# Patient Record
Sex: Female | Born: 1995 | Hispanic: Yes | Marital: Single | Smoking: Never smoker
Health system: Southern US, Community
[De-identification: ages and names within clinical notes are randomized; demographics above are authoritative.]

## PROBLEM LIST (undated history)

## (undated) DIAGNOSIS — F419 Anxiety disorder, unspecified: Secondary | ICD-10-CM

## (undated) DIAGNOSIS — R519 Headache, unspecified: Secondary | ICD-10-CM

## (undated) HISTORY — DX: Headache, unspecified: R51.9

## (undated) HISTORY — DX: Anxiety disorder, unspecified: F41.9

---

## 2020-01-14 ENCOUNTER — Other Ambulatory Visit: Payer: Self-pay

## 2020-01-17 ENCOUNTER — Other Ambulatory Visit: Payer: Self-pay

## 2020-01-17 ENCOUNTER — Encounter: Payer: Self-pay | Admitting: Nurse Practitioner

## 2020-01-17 ENCOUNTER — Ambulatory Visit (INDEPENDENT_AMBULATORY_CARE_PROVIDER_SITE_OTHER): Payer: Managed Care, Other (non HMO) | Admitting: Nurse Practitioner

## 2020-01-17 VITALS — BP 136/78 | HR 95 | Temp 98.4°F | Ht 60.0 in | Wt 176.0 lb

## 2020-01-17 DIAGNOSIS — F419 Anxiety disorder, unspecified: Secondary | ICD-10-CM | POA: Insufficient documentation

## 2020-01-17 DIAGNOSIS — Z0000 Encounter for general adult medical examination without abnormal findings: Secondary | ICD-10-CM

## 2020-01-17 DIAGNOSIS — Z114 Encounter for screening for human immunodeficiency virus [HIV]: Secondary | ICD-10-CM | POA: Insufficient documentation

## 2020-01-17 DIAGNOSIS — R590 Localized enlarged lymph nodes: Secondary | ICD-10-CM | POA: Diagnosis not present

## 2020-01-17 DIAGNOSIS — E66811 Obesity, class 1: Secondary | ICD-10-CM | POA: Insufficient documentation

## 2020-01-17 DIAGNOSIS — Z1159 Encounter for screening for other viral diseases: Secondary | ICD-10-CM | POA: Insufficient documentation

## 2020-01-17 DIAGNOSIS — E669 Obesity, unspecified: Secondary | ICD-10-CM

## 2020-01-17 LAB — CBC WITH DIFFERENTIAL/PLATELET
Basophils Absolute: 0 10*3/uL (ref 0.0–0.1)
Basophils Relative: 0.4 % (ref 0.0–3.0)
Eosinophils Absolute: 0.1 10*3/uL (ref 0.0–0.7)
Eosinophils Relative: 1.8 % (ref 0.0–5.0)
HCT: 39.4 % (ref 36.0–46.0)
Hemoglobin: 13.4 g/dL (ref 12.0–15.0)
Lymphocytes Relative: 29.6 % (ref 12.0–46.0)
Lymphs Abs: 2.1 10*3/uL (ref 0.7–4.0)
MCHC: 34.1 g/dL (ref 30.0–36.0)
MCV: 89.9 fl (ref 78.0–100.0)
Monocytes Absolute: 0.5 10*3/uL (ref 0.1–1.0)
Monocytes Relative: 7.4 % (ref 3.0–12.0)
Neutro Abs: 4.4 10*3/uL (ref 1.4–7.7)
Neutrophils Relative %: 60.8 % (ref 43.0–77.0)
Platelets: 337 10*3/uL (ref 150.0–400.0)
RBC: 4.38 Mil/uL (ref 3.87–5.11)
RDW: 13.4 % (ref 11.5–15.5)
WBC: 7.2 10*3/uL (ref 4.0–10.5)

## 2020-01-17 LAB — LIPID PANEL
Cholesterol: 150 mg/dL (ref 0–200)
HDL: 45.4 mg/dL (ref >39.00)
LDL Cholesterol: 93 mg/dL (ref 0–99)
NonHDL: 104.39
Total CHOL/HDL Ratio: 3
Triglycerides: 56 mg/dL (ref 0.0–149.0)
VLDL: 11.2 mg/dL (ref 0.0–40.0)

## 2020-01-17 LAB — COMPREHENSIVE METABOLIC PANEL
ALT: 13 U/L (ref 0–35)
AST: 15 U/L (ref 0–37)
Albumin: 4.5 g/dL (ref 3.5–5.2)
Alkaline Phosphatase: 54 U/L (ref 39–117)
BUN: 11 mg/dL (ref 6–23)
CO2: 26 mEq/L (ref 19–32)
Calcium: 9.9 mg/dL (ref 8.4–10.5)
Chloride: 103 mEq/L (ref 96–112)
Creatinine, Ser: 0.58 mg/dL (ref 0.40–1.20)
GFR: 128.52 mL/min (ref >60.00)
Glucose, Bld: 87 mg/dL (ref 70–99)
Potassium: 4 mEq/L (ref 3.5–5.1)
Sodium: 138 mEq/L (ref 135–145)
Total Bilirubin: 0.7 mg/dL (ref 0.2–1.2)
Total Protein: 7.5 g/dL (ref 6.0–8.3)

## 2020-01-17 LAB — B12 AND FOLATE PANEL
Folate: >24.8 ng/mL (ref >5.9)
Vitamin B-12: 257 pg/mL (ref 211–911)

## 2020-01-17 LAB — VITAMIN D 25 HYDROXY (VIT D DEFICIENCY, FRACTURES): VITD: 18.96 ng/mL — ABNORMAL LOW (ref 30.00–100.00)

## 2020-01-17 LAB — HEMOGLOBIN A1C: Hgb A1c MFr Bld: 5.2 % (ref 4.6–6.5)

## 2020-01-17 LAB — TSH: TSH: 1.19 u[IU]/mL (ref 0.35–4.50)

## 2020-01-17 NOTE — Patient Instructions (Addendum)
Please go to the lab today.   Please return to office visit in 1 week.  Referral place to behavioral health.    Referral placed to ENT for left neck lymph node swelling with no obvious infection.I am checking labs today.   Office visit in one week and call back sooner with any worsening symptoms.    Managing Anxiety, Adult After being diagnosed with an anxiety disorder, you may be relieved to know why you have felt or behaved a certain way. You may also feel overwhelmed about the treatment ahead and what it will mean for your life. With care and support, you can manage this condition and recover from it. How to manage lifestyle changes Managing stress and anxiety  Stress is your body's reaction to life changes and events, both good and bad. Most stress will last just a few hours, but stress can be ongoing and can lead to more than just stress. Although stress can play a major role in anxiety, it is not the same as anxiety. Stress is usually caused by something external, such as a deadline, test, or competition. Stress normally passes after the triggering event has ended.  Anxiety is caused by something internal, such as imagining a terrible outcome or worrying that something will go wrong that will devastate you. Anxiety often does not go away even after the triggering event is over, and it can become long-term (chronic) worry. It is important to understand the differences between stress and anxiety and to manage your stress effectively so that it does not lead to an anxious response. Talk with your health care provider or a counselor to learn more about reducing anxiety and stress. He or she may suggest tension reduction techniques, such as:  Music therapy. This can include creating or listening to music that you enjoy and that inspires you.  Mindfulness-based meditation. This involves being aware of your normal breaths while not trying to control your breathing. It can be done while sitting  or walking.  Centering prayer. This involves focusing on a word, phrase, or sacred image that means something to you and brings you peace.  Deep breathing. To do this, expand your stomach and inhale slowly through your nose. Hold your breath for 3-5 seconds. Then exhale slowly, letting your stomach muscles relax.  Self-talk. This involves identifying thought patterns that lead to anxiety reactions and changing those patterns.  Muscle relaxation. This involves tensing muscles and then relaxing them. Choose a tension reduction technique that suits your lifestyle and personality. These techniques take time and practice. Set aside 5-15 minutes a day to do them. Therapists can offer counseling and training in these techniques. The training to help with anxiety may be covered by some insurance plans. Other things you can do to manage stress and anxiety include:  Keeping a stress/anxiety diary. This can help you learn what triggers your reaction and then learn ways to manage your response.  Thinking about how you react to certain situations. You may not be able to control everything, but you can control your response.  Making time for activities that help you relax and not feeling guilty about spending your time in this way.  Visual imagery and yoga can help you stay calm and relax.  Medicines Medicines can help ease symptoms. Medicines for anxiety include:  Anti-anxiety drugs.  Antidepressants. Medicines are often used as a primary treatment for anxiety disorder. Medicines will be prescribed by a health care provider. When used together, medicines, psychotherapy, and tension  reduction techniques may be the most effective treatment. Relationships Relationships can play a big part in helping you recover. Try to spend more time connecting with trusted friends and family members. Consider going to couples counseling, taking family education classes, or going to family therapy. Therapy can help you  and others better understand your condition. How to recognize changes in your anxiety Everyone responds differently to treatment for anxiety. Recovery from anxiety happens when symptoms decrease and stop interfering with your daily activities at home or work. This may mean that you will start to:  Have better concentration and focus. Worry will interfere less in your daily thinking.  Sleep better.  Be less irritable.  Have more energy.  Have improved memory. It is important to recognize when your condition is getting worse. Contact your health care provider if your symptoms interfere with home or work and you feel like your condition is not improving. Follow these instructions at home: Activity  Exercise. Most adults should do the following: ? Exercise for at least 150 minutes each week. The exercise should increase your heart rate and make you sweat (moderate-intensity exercise). ? Strengthening exercises at least twice a week.  Get the right amount and quality of sleep. Most adults need 7-9 hours of sleep each night. Lifestyle   Eat a healthy diet that includes plenty of vegetables, fruits, whole grains, low-fat dairy products, and lean protein. Do not eat a lot of foods that are high in solid fats, added sugars, or salt.  Make choices that simplify your life.  Do not use any products that contain nicotine or tobacco, such as cigarettes, e-cigarettes, and chewing tobacco. If you need help quitting, ask your health care provider.  Avoid caffeine, alcohol, and certain over-the-counter cold medicines. These may make you feel worse. Ask your pharmacist which medicines to avoid. General instructions  Take over-the-counter and prescription medicines only as told by your health care provider.  Keep all follow-up visits as told by your health care provider. This is important. Where to find support You can get help and support from these sources:  Self-help groups.  Online and  [employer] organizations.  A trusted spiritual leader.  Couples counseling.  Family education classes.  Family therapy. Where to find more information You may find that joining a support group helps you deal with your anxiety. The following sources can help you locate counselors or support groups near you:  Mental Health America: www.mentalhealthamerica.net  Anxiety and Depression Association [place] (ADAA): ProgramCam.dewww.adaa.org  [employer] on Mental Illness (NAMI): www.nami.org Contact a health care provider if you:  Have a hard time staying focused or finishing daily tasks.  Spend many hours a day feeling worried about everyday life.  Become exhausted by worry.  Start to have headaches, feel tense, or have nausea.  Urinate more than normal.  Have diarrhea. Get help right away if you have:  A racing heart and shortness of breath.  Thoughts of hurting yourself or others. If you ever feel like you may hurt yourself or others, or have thoughts about taking your own life, get help right away. You can go to your nearest emergency department or call:  Your local emergency services (911 in the [place]  A suicide crisis helpline, such as the National Suicide Prevention Lifeline at (573)616-86171-651-028-4173. This is open 24 hours a day. Summary  Taking steps to learn and use tension reduction techniques can help calm you and help prevent triggering an anxiety reaction.  When used together,  medicines, psychotherapy, and tension reduction techniques may be the most effective treatment.  Family, friends, and partners can play a big part in helping you recover from an anxiety disorder. This information is not intended to replace advice given to you by your health care provider. Make sure you discuss any questions you have with your health care provider. Document Revised: 10/13/2018 Document Reviewed: 10/13/2018 Elsevier Patient Education  2020 Elsevier  Inc.  Lymphadenopathy  Lymphadenopathy means that your lymph glands are swollen or larger than normal (enlarged). Lymph glands, also called lymph nodes, are collections of tissue that filter bacteria, viruses, and waste from your bloodstream. They are part of your body's disease-fighting system (immune system), which protects your body from germs. There may be different causes of lymphadenopathy, depending on where it is in your body. Some types go away on their own. Lymphadenopathy can occur anywhere that you have lymph glands, including these areas:  Neck (cervical lymphadenopathy).  Chest (mediastinal lymphadenopathy).  Lungs (hilar lymphadenopathy).  Underarms (axillary lymphadenopathy).  Groin (inguinal lymphadenopathy). When your immune system responds to germs, infection-fighting cells and fluid build up in your lymph glands. This causes some swelling and enlargement. If the lymph glands do not go back to normal after you have an infection or disease, your health care provider may do tests. These tests help to monitor your condition and find the reason why the glands are still swollen and enlarged. Follow these instructions at home:  Get plenty of rest.  Take over-the-counter and prescription medicines only as told by your health care provider. Your health care provider may recommend over-the-counter medicines for pain.  If directed, apply heat to swollen lymph glands as often as told by your health care provider. Use the heat source that your health care provider recommends, such as a moist heat pack or a heating pad. ? Place a towel between your skin and the heat source. ? Leave the heat on for 20-30 minutes. ? Remove the heat if your skin turns bright red. This is especially important if you are unable to feel pain, heat, or cold. You may have a greater risk of getting burned.  Check your affected lymph glands every day for changes. Check other lymph gland areas as told by your  health care provider. Check for changes such as: ? More swelling. ? Sudden increase in size. ? Redness or pain. ? Hardness.  Keep all follow-up visits as told by your health care provider. This is important. Contact a health care provider if you have:  Swelling that gets worse or spreads to other areas.  Problems with breathing.  Lymph glands that: ? Are still swollen after 2 weeks. ? Have suddenly gotten bigger. ? Are red, painful, or hard.  A fever or chills.  Fatigue.  A sore throat.  Pain in your abdomen.  Weight loss.  Night sweats. Get help right away if you have:  Fluid leaking from an enlarged lymph gland.  Severe pain.  Chest pain.  Shortness of breath. Summary  Lymphadenopathy means that your lymph glands are swollen or larger than normal (enlarged).  Lymph glands (also called lymph nodes) are collections of tissue that filter bacteria, viruses, and waste from the bloodstream. They are part of your body's disease-fighting system (immune system).  Lymphadenopathy can occur anywhere that you have lymph glands.  If your enlarged and swollen lymph glands do not go back to normal after you have an infection or disease, your health care provider may do tests to  monitor your condition and find the reason why the glands are still swollen and enlarged.  Check your affected lymph glands every day for changes. Check other lymph gland areas as told by your health care provider. This information is not intended to replace advice given to you by your health care provider. Make sure you discuss any questions you have with your health care provider. Document Revised: 04/25/2017 Document Reviewed: 03/28/2017 Elsevier Patient Education  2020 [employer].

## 2020-01-17 NOTE — Progress Notes (Signed)
New Patient Office Visit  Subjective:  Patient ID: Ariana Powell, female    DOB: 1995-12-01  Age: 24 y.o. MRN: [id number]  CC:  Chief Complaint  Patient presents with  . New Patient (Initial Visit)    establish care    HPI Ariana Powell is a healthy 24 yo with hx of anxiety presents to establish care with a primary care provider.  She has concerns about a lump on her left neck.   Left lump: Patient reports 1 month ago, she noted a lymph node that was enlarged and tender and it seemed to come and go within a few hours.  She had no other symptoms so she did not think much about it.  Last week, it popped up again and has persisted.  This is a large tender lump that hurts when she presses on it.  She has had no fevers or chills or left ear pain sore throat or upper respiratory symptoms.  Once in a while she gets a right earache.  No lymph nodes are elevated on the right side.   Anxiety: Patient reports chronic history of anxiety this with her for many years.  She has never had it evaluated.  She exists experiences anxiety whenever she has to do something new.  She has to drive to a new place, that worries her and she has to map out the trip ahead of time.  She does not like to drive.  Driving scares her.  She does not have panic attacks.  She does not feel depressed.  She is sleeping well.  Her mother takes Zoloft, and her sister was placed on anxiety medication.  Patient would not be adverse to taking medication.  PHQ 9:  6.  GAD-7: 15.  No SI or HI.  BMI 34.37/Obesity:  Wt Readings from Last 3 Encounters:  01/17/20 176 lb (79.8 kg)   Immunizations: Covid UTD, wants Flu today Diet: Exercise: Pap Smear:  LMP 2 weeks ago. Not using birth control  and not sex active  Past Medical History:  Diagnosis Date  . Anxiety   . Frequent headaches     History reviewed. No pertinent surgical history.  Family History  Problem Relation Age of Onset  . Cancer Maternal Grandmother   .  Diabetes Maternal Grandmother   . Heart attack Maternal Grandmother   . Stroke Maternal Grandfather     Social History   Socioeconomic History  . Marital status: Single    Spouse name: Not on file  . Number of children: Not on file  . Years of education: Not on file  . Highest education level: Bachelor's degree (e.g., BA, AB, BS)  Occupational History  . Occupation: Nurse, adult  Tobacco Use  . Smoking status: Never Smoker  . Smokeless tobacco: Never Used  Vaping Use  . Vaping Use: Never used  Substance and Sexual Activity  . Alcohol use: Yes    Comment: socially 2-3 drinks every couple months  . Drug use: Never  . Sexual activity: Not Currently    Partners: Male  Other Topics Concern  . Not on file  Social History Narrative   [employer] college advisor   Social Determinants of Health   Financial Resource Strain:   . Difficulty of Paying Living Expenses: Not on file  Food Insecurity:   . Worried About Programme researcher, broadcasting/film/video in the Last Year: Not on file  . Ran Out of Food in the Last Year: Not on file  Transportation  Needs:   . Lack of Transportation (Medical): Not on file  . Lack of Transportation (Non-Medical): Not on file  Physical Activity:   . Days of Exercise per Week: Not on file  . Minutes of Exercise per Session: Not on file  Stress:   . Feeling of Stress : Not on file  Social Connections:   . Frequency of Communication with Friends and Family: Not on file  . Frequency of Social Gatherings with Friends and Family: Not on file  . Attends Religious Services: Not on file  . Active Member of Clubs or Organizations: Not on file  . Attends Banker Meetings: Not on file  . Marital Status: Not on file  Intimate Partner Violence:   . Fear of Current or Ex-Partner: Not on file  . Emotionally Abused: Not on file  . Physically Abused: Not on file  . Sexually Abused: Not on file    Review of Systems  Constitutional: Negative for chills, fatigue and  fever.  HENT: Positive for ear pain. Negative for congestion, dental problem, facial swelling, hearing loss, postnasal drip, rhinorrhea, sinus pressure, sinus pain, sore throat, tinnitus, trouble swallowing and voice change.        Random right ear pain. None on left.   Eyes: Negative.   Respiratory: Negative for cough and shortness of breath.   Cardiovascular: Negative for chest pain and palpitations.  Gastrointestinal: Negative.   Endocrine: Negative.   Genitourinary: Negative.   Musculoskeletal: Negative.   Allergic/Immunologic: Negative.   Neurological: Negative for dizziness, light-headedness and headaches.  Hematological: Negative.   Psychiatric/Behavioral:       See HPI     Objective:   Today's Vitals: BP 136/78 (BP Location: Left Arm, Patient Position: Sitting, Cuff Size: Normal)   Pulse 95   Temp 98.4 F (36.9 C) (Oral)   Ht 5' (1.524 m)   Wt 176 lb (79.8 kg)   SpO2 98%   BMI 34.37 kg/m   Physical Exam Vitals reviewed.  Constitutional:      Appearance: Normal appearance.  HENT:     Head: Normocephalic and atraumatic.     Right Ear: Tympanic membrane, ear canal and external ear normal.     Left Ear: Tympanic membrane, ear canal and external ear normal.     Nose: Nose normal.     Mouth/Throat:     Mouth: Mucous membranes are moist.     Pharynx: Oropharynx is clear. No oropharyngeal exudate or posterior oropharyngeal erythema.  Eyes:     Conjunctiva/sclera: Conjunctivae normal.     Pupils: Pupils are equal, round, and reactive to light.  Neck:     Comments: Left anterior cervical lymph node visible swelling and tender Cardiovascular:     Rate and Rhythm: Normal rate and regular rhythm.     Pulses: Normal pulses.     Heart sounds: Normal heart sounds.  Pulmonary:     Effort: Pulmonary effort is normal.     Breath sounds: Normal breath sounds.  Abdominal:     Palpations: Abdomen is soft.     Tenderness: There is no abdominal tenderness.  Musculoskeletal:         General: Normal range of motion.     Cervical back: Normal range of motion and neck supple.  Lymphadenopathy:     Cervical: Cervical adenopathy present.  Skin:    General: Skin is warm and dry.  Neurological:     General: No focal deficit present.     Mental Status: She  is alert and oriented to person, place, and time.  Psychiatric:        Mood and Affect: Mood normal.        Behavior: Behavior normal.        Thought Content: Thought content normal.        Judgment: Judgment normal.     Assessment & Plan:   Problem List Items Addressed This Visit      Immune and Lymphatic   Adenopathy, cervical    Large left anterior cervical lymph node and no sings of infection with head and neck exam. Will check CBC and ref to ENT urgent so the node can be seen.       Relevant Orders   CBC with Differential/Platelet (Completed)   Ambulatory referral to ENT     Other   Anxiety    Patient describes GAD around driving but GAD score is 15. Labs to day, and Behav health. Zoloft used by mother. Consider new start Zoloft in 1 week f/up.       Relevant Orders   VITAMIN D 25 Hydroxy (Vit-D Deficiency, Fractures) (Completed)   B12 and Folate Panel (Completed)   Ambulatory referral to Psychology   Screening for HIV (human immunodeficiency virus)   Relevant Orders   HIV Antibody (routine testing w rflx) (Completed)   Encounter for HCV screening test for low risk patient   Relevant Orders   Hepatitis C antibody (Completed)   Obesity (BMI 30.0-34.9)    Healthy diet and exercise- which will help stress/anxiety.       Relevant Orders   TSH (Completed)   Comprehensive metabolic panel (Completed)   Hemoglobin A1c (Completed)   Lipid panel (Completed)   Encounter for medical examination to establish care - Primary      No outpatient encounter medications on file as of 01/17/2020.   No facility-administered encounter medications on file as of 01/17/2020.   Please go to the lab today.    Please return to office visit in 1 week.  Referral place to behavioral health.    Referral placed to ENT for left neck lymph node swelling with no obvious infection.I am checking labs today.   Office visit in one week and call back sooner with any worsening symptoms.   Follow-up: Return in about 3 weeks (around 02/07/2020).  This visit occurred during the SARS-CoV-2 public health emergency.  Safety protocols were in place, including screening questions prior to the visit, additional usage of staff PPE, and extensive cleaning of exam room while observing appropriate contact time as indicated for disinfecting solutions.   Amedeo Kinsman, NP

## 2020-01-18 LAB — HEPATITIS C ANTIBODY
Hepatitis C Ab: NONREACTIVE
SIGNAL TO CUT-OFF: 0.01 (ref <1.00)

## 2020-01-18 LAB — HIV ANTIBODY (ROUTINE TESTING W REFLEX): HIV 1&2 Ab, 4th Generation: NONREACTIVE

## 2020-01-19 ENCOUNTER — Encounter: Payer: Self-pay | Admitting: Nurse Practitioner

## 2020-01-19 NOTE — Assessment & Plan Note (Signed)
Large left anterior cervical lymph node and no sings of infection with head and neck exam. Will check CBC and ref to ENT urgent so the node can be seen.

## 2020-01-19 NOTE — Assessment & Plan Note (Signed)
Healthy diet and exercise- which will help stress/anxiety.

## 2020-01-19 NOTE — Assessment & Plan Note (Signed)
Patient describes GAD around driving but GAD score is 15. Labs to day, and Behav health. Zoloft used by mother. Consider new start Zoloft in 1 week f/up.

## 2020-02-01 ENCOUNTER — Other Ambulatory Visit: Payer: Self-pay | Admitting: Physician Assistant

## 2020-02-01 DIAGNOSIS — R221 Localized swelling, mass and lump, neck: Secondary | ICD-10-CM

## 2020-02-02 ENCOUNTER — Ambulatory Visit (INDEPENDENT_AMBULATORY_CARE_PROVIDER_SITE_OTHER): Payer: 59 | Admitting: Psychology

## 2020-02-03 ENCOUNTER — Other Ambulatory Visit: Payer: Self-pay

## 2020-02-03 DIAGNOSIS — F419 Anxiety disorder, unspecified: Secondary | ICD-10-CM

## 2020-02-04 ENCOUNTER — Encounter: Payer: Self-pay | Admitting: Nurse Practitioner

## 2020-02-07 ENCOUNTER — Encounter: Payer: Self-pay | Admitting: Nurse Practitioner

## 2020-02-07 ENCOUNTER — Other Ambulatory Visit: Payer: Self-pay

## 2020-02-07 ENCOUNTER — Ambulatory Visit: Payer: Managed Care, Other (non HMO) | Admitting: Nurse Practitioner

## 2020-02-07 VITALS — BP 122/84 | HR 91 | Temp 98.4°F | Ht 60.0 in | Wt 172.0 lb

## 2020-02-07 DIAGNOSIS — R590 Localized enlarged lymph nodes: Secondary | ICD-10-CM | POA: Diagnosis not present

## 2020-02-07 DIAGNOSIS — F419 Anxiety disorder, unspecified: Secondary | ICD-10-CM | POA: Diagnosis not present

## 2020-02-07 DIAGNOSIS — E669 Obesity, unspecified: Secondary | ICD-10-CM

## 2020-02-07 MED ORDER — SERTRALINE HCL 50 MG PO TABS: ORAL_TABLET | ORAL | 0 refills | Status: DC

## 2020-02-07 NOTE — Patient Instructions (Addendum)
Sertraline: start 1/2 tablet once daily for 1 week and then increase to a full tablet once daily on week two as tolerated.  Common side effects such as nausea, GI upset,  drowsiness and weight gain.  Monitor for  rare but serious side effect of suicide ideation. Please discontinue medication go directly to ED if this occurs.     Plan follow up in 1 month to evaluate progress.    Continue with the plans with the ears nose and throat provider.  Great job with [employer] and I highly encourage you to continue with that.  Exercise is a great anxiety buster as well.  You are right to cut back on your caffeine use as that will help the heart rate not it is fast or you feel as anxious.     Managing Anxiety, Adult After being diagnosed with an anxiety disorder, you may be relieved to know why you have felt or behaved a certain way. You may also feel overwhelmed about the treatment ahead and what it will mean for your life. With care and support, you can manage this condition and recover from it. How to manage lifestyle changes Managing stress and anxiety  Stress is your body's reaction to life changes and events, both good and bad. Most stress will last just a few hours, but stress can be ongoing and can lead to more than just stress. Although stress can play a major role in anxiety, it is not the same as anxiety. Stress is usually caused by something external, such as a deadline, test, or competition. Stress normally passes after the triggering event has ended.  Anxiety is caused by something internal, such as imagining a terrible outcome or worrying that something will go wrong that will devastate you. Anxiety often does not go away even after the triggering event is over, and it can become long-term (chronic) worry. It is important to understand the differences between stress and anxiety and to manage your stress effectively so that it does not lead to an anxious response. Talk with your health  care provider or a counselor to learn more about reducing anxiety and stress. He or she may suggest tension reduction techniques, such as:  Music therapy. This can include creating or listening to music that you enjoy and that inspires you.  Mindfulness-based meditation. This involves being aware of your normal breaths while not trying to control your breathing. It can be done while sitting or walking.  Centering prayer. This involves focusing on a word, phrase, or sacred image that means something to you and brings you peace.  Deep breathing. To do this, expand your stomach and inhale slowly through your nose. Hold your breath for 3-5 seconds. Then exhale slowly, letting your stomach muscles relax.  Self-talk. This involves identifying thought patterns that lead to anxiety reactions and changing those patterns.  Muscle relaxation. This involves tensing muscles and then relaxing them. Choose a tension reduction technique that suits your lifestyle and personality. These techniques take time and practice. Set aside 5-15 minutes a day to do them. Therapists can offer counseling and training in these techniques. The training to help with anxiety may be covered by some insurance plans. Other things you can do to manage stress and anxiety include:  Keeping a stress/anxiety diary. This can help you learn what triggers your reaction and then learn ways to manage your response.  Thinking about how you react to certain situations. You may not be able to control everything, but  you can control your response.  Making time for activities that help you relax and not feeling guilty about spending your time in this way.  Visual imagery and yoga can help you stay calm and relax.  Medicines Medicines can help ease symptoms. Medicines for anxiety include:  Anti-anxiety drugs.  Antidepressants. Medicines are often used as a primary treatment for anxiety disorder. Medicines will be prescribed by a health  care provider. When used together, medicines, psychotherapy, and tension reduction techniques may be the most effective treatment. Relationships Relationships can play a big part in helping you recover. Try to spend more time connecting with trusted friends and family members. Consider going to couples counseling, taking family education classes, or going to family therapy. Therapy can help you and others better understand your condition. How to recognize changes in your anxiety Everyone responds differently to treatment for anxiety. Recovery from anxiety happens when symptoms decrease and stop interfering with your daily activities at home or work. This may mean that you will start to:  Have better concentration and focus. Worry will interfere less in your daily thinking.  Sleep better.  Be less irritable.  Have more energy.  Have improved memory. It is important to recognize when your condition is getting worse. Contact your health care provider if your symptoms interfere with home or work and you feel like your condition is not improving. Follow these instructions at home: Activity  Exercise. Most adults should do the following: ? Exercise for at least 150 minutes each week. The exercise should increase your heart rate and make you sweat (moderate-intensity exercise). ? Strengthening exercises at least twice a week.  Get the right amount and quality of sleep. Most adults need 7-9 hours of sleep each night. Lifestyle   Eat a healthy diet that includes plenty of vegetables, fruits, whole grains, low-fat dairy products, and lean protein. Do not eat a lot of foods that are high in solid fats, added sugars, or salt.  Make choices that simplify your life.  Do not use any products that contain nicotine or tobacco, such as cigarettes, e-cigarettes, and chewing tobacco. If you need help quitting, ask your health care provider.  Avoid caffeine, alcohol, and certain over-the-counter cold  medicines. These may make you feel worse. Ask your pharmacist which medicines to avoid. General instructions  Take over-the-counter and prescription medicines only as told by your health care provider.  Keep all follow-up visits as told by your health care provider. This is important. Where to find support You can get help and support from these sources:  Self-help groups.  Online and [employer].  A trusted spiritual leader.  Couples counseling.  Family education classes.  Family therapy. Where to find more information You may find that joining a support group helps you deal with your anxiety. The following sources can help you locate counselors or support groups near you:  Mental Health America: www.mentalhealthamerica.net  Anxiety and Depression Association of [place] (ADAA): ProgramCam.de  [employer] on Mental Illness (NAMI): www.nami.org Contact a health care provider if you:  Have a hard time staying focused or finishing daily tasks.  Spend many hours a day feeling worried about everyday life.  Become exhausted by worry.  Start to have headaches, feel tense, or have nausea.  Urinate more than normal.  Have diarrhea. Get help right away if you have:  A racing heart and shortness of breath.  Thoughts of hurting yourself or others. If you ever feel like you may hurt yourself  or others, or have thoughts about taking your own life, get help right away. You can go to your nearest emergency department or call:  Your local emergency services (911 in the [place]  A suicide crisis helpline, such as the National Suicide Prevention Lifeline at 954 588 8603. This is open 24 hours a day. Summary  Taking steps to learn and use tension reduction techniques can help calm you and help prevent triggering an anxiety reaction.  When used together, medicines, psychotherapy, and tension reduction techniques may be the most effective treatment.  Family,  friends, and partners can play a big part in helping you recover from an anxiety disorder. This information is not intended to replace advice given to you by your health care provider. Make sure you discuss any questions you have with your health care provider. Document Revised: 10/13/2018 Document Reviewed: 10/13/2018 Elsevier Patient Education  2020 [employer].

## 2020-02-07 NOTE — Progress Notes (Signed)
Established Patient Office Visit  Subjective:  Patient ID: Ariana Powell, female    DOB: 1995-06-03  Age: 24 y.o. MRN: [id number]  CC:  Chief Complaint  Patient presents with  . Follow-up    HPI Ariana Powell is a 24 year old who presents for follow-up office visit for lymphadenopathy and anxiety.  She would like to discuss starting medication for anxiety.   Cervical neck adenopathy: She was sent to ENT and has had a testing done.  She has had an antibiotic therapy with no change.  She is now going to have a CT scan next Monday of the neck.  She has no constitutional symptoms.  They're expecting to have benign results.  Anxiety: sleeps well. Still feels anxious with driving, no change. She has not tried to go anywhere new or scary. Wants to try Zoloft. She saw the behavioral therapist  Alma Downs. Has been practicing deep breathing. Coffee- decaf now and this is a change from  normally 2-3 cups caffeine in the am and it is helpful-less tachycardia.  She had high GAD scores last month of 15, PHQ 9 of 6.  No SI or HI.   Low Vit D: She is taking 1000 IU daily.  Tolerating well.    BMI 33/obesity: She is trying to lose weight is on weight watchers.  Try to get more exercise.   Back to school as a Engineer, production in high school and enjoying that.  Pap test is up-to-date.    No menstrual concerns as a menstrual cycles very light normal.  Not using birth control as reports not sexually active.   Past Medical History:  Diagnosis Date  . Anxiety   . Frequent headaches     No past surgical history on file.  Family History  Problem Relation Age of Onset  . Cancer Maternal Grandmother   . Diabetes Maternal Grandmother   . Heart attack Maternal Grandmother   . Stroke Maternal Grandfather     Social History   Socioeconomic History  . Marital status: Single    Spouse name: Not on file  . Number of children: Not on file  . Years of education: Not on file  . Highest  education level: Bachelor's degree (e.g., BA, AB, BS)  Occupational History  . Occupation: Nurse, adult  Tobacco Use  . Smoking status: Never Smoker  . Smokeless tobacco: Never Used  Vaping Use  . Vaping Use: Never used  Substance and Sexual Activity  . Alcohol use: Yes    Comment: socially 2-3 drinks every couple months  . Drug use: Never  . Sexual activity: Not Currently    Partners: Male  Other Topics Concern  . Not on file  Social History Narrative   [employer] college advisor   Social Determinants of Health   Financial Resource Strain:   . Difficulty of Paying Living Expenses: Not on file  Food Insecurity:   . Worried About Programme researcher, broadcasting/film/video in the Last Year: Not on file  . Ran Out of Food in the Last Year: Not on file  Transportation Needs:   . Lack of Transportation (Medical): Not on file  . Lack of Transportation (Non-Medical): Not on file  Physical Activity:   . Days of Exercise per Week: Not on file  . Minutes of Exercise per Session: Not on file  Stress:   . Feeling of Stress : Not on file  Social Connections:   . Frequency of Communication with Friends and Family: Not on  file  . Frequency of Social Gatherings with Friends and Family: Not on file  . Attends Religious Services: Not on file  . Active Member of Clubs or Organizations: Not on file  . Attends Banker Meetings: Not on file  . Marital Status: Not on file  Intimate Partner Violence:   . Fear of Current or Ex-Partner: Not on file  . Emotionally Abused: Not on file  . Physically Abused: Not on file  . Sexually Abused: Not on file    No outpatient medications prior to visit.   No facility-administered medications prior to visit.    No Known Allergies  Review of Systems  Constitutional: Negative.   HENT: Negative.   Eyes: Negative.   Respiratory: Negative.   Cardiovascular: Negative.   Gastrointestinal: Negative.   Endocrine: Negative.   Genitourinary: Negative.     Musculoskeletal: Negative.   Neurological: Negative.   Psychiatric/Behavioral:       See HPI      Objective:    Physical Exam Vitals reviewed.  Constitutional:      Appearance: She is obese.  Eyes:     Conjunctiva/sclera: Conjunctivae normal.     Pupils: Pupils are equal, round, and reactive to light.  Cardiovascular:     Rate and Rhythm: Normal rate and regular rhythm.     Pulses: Normal pulses.     Heart sounds: Normal heart sounds.  Pulmonary:     Effort: Pulmonary effort is normal.  Abdominal:     Palpations: Abdomen is soft.     Tenderness: There is no abdominal tenderness.  Musculoskeletal:        General: Normal range of motion.     Cervical back: Normal range of motion and neck supple.  Skin:    General: Skin is warm and dry.  Neurological:     Mental Status: She is alert.  Psychiatric:        Mood and Affect: Mood normal.        Behavior: Behavior normal.     BP 122/84   Pulse 91   Temp 98.4 F (36.9 C) (Oral)   Ht 5' (1.524 m)   Wt 172 lb (78 kg)   LMP 01/31/2020 (Within Days)   SpO2 98%   BMI 33.59 kg/m  Wt Readings from Last 3 Encounters:  02/07/20 172 lb (78 kg)  01/17/20 176 lb (79.8 kg)   Pulse Readings from Last 3 Encounters:  02/07/20 91  01/17/20 95    BP Readings from Last 3 Encounters:  02/07/20 122/84  01/17/20 136/78    Lab Results  Component Value Date   CHOL 150 01/17/2020   HDL 45.40 01/17/2020   LDLCALC 93 01/17/2020   TRIG 56.0 01/17/2020   CHOLHDL 3 01/17/2020      Health Maintenance Due  Topic Date Due  . PAP-Cervical Cytology Screening  Never done  . PAP SMEAR-Modifier  Never done  . TETANUS/TDAP  12/21/2017  . INFLUENZA VACCINE  12/26/2019    There are no preventive care reminders to display for this patient.  Lab Results  Component Value Date   TSH 1.19 01/17/2020   Lab Results  Component Value Date   WBC 7.2 01/17/2020   HGB 13.4 01/17/2020   HCT 39.4 01/17/2020   MCV 89.9 01/17/2020   PLT  337.0 01/17/2020   Lab Results  Component Value Date   NA 138 01/17/2020   K 4.0 01/17/2020   CO2 26 01/17/2020   GLUCOSE 87 01/17/2020  BUN 11 01/17/2020   CREATININE 0.58 01/17/2020   BILITOT 0.7 01/17/2020   ALKPHOS 54 01/17/2020   AST 15 01/17/2020   ALT 13 01/17/2020   PROT 7.5 01/17/2020   ALBUMIN 4.5 01/17/2020   CALCIUM 9.9 01/17/2020   GFR 128.52 01/17/2020   Lab Results  Component Value Date   CHOL 150 01/17/2020   Lab Results  Component Value Date   HDL 45.40 01/17/2020   Lab Results  Component Value Date   LDLCALC 93 01/17/2020   Lab Results  Component Value Date   TRIG 56.0 01/17/2020   Lab Results  Component Value Date   CHOLHDL 3 01/17/2020   Lab Results  Component Value Date   HGBA1C 5.2 01/17/2020      Assessment & Plan:   Problem List Items Addressed This Visit      Immune and Lymphatic   Adenopathy, cervical     Other   Anxiety - Primary   Relevant Medications   sertraline (ZOLOFT) 50 MG tablet   Obesity (BMI 30.0-34.9)      Meds ordered this encounter  Medications  . sertraline (ZOLOFT) 50 MG tablet    Sig: Take 1/2 tablet by mouth nightly and then 1 tablet nighty    Dispense:  90 tablet    Refill:  0    Order Specific Question:   Supervising Provider    Answer:   Dale Hendron [[id number]]   Sertraline: start 1/2 tablet once daily for 1 week and then increase to a full tablet once daily on week two as tolerated.  Common side effects such as nausea, GI upset,  drowsiness and weight gain.  Monitor for  rare but serious side effect of suicide ideation. Please discontinue medication go directly to ED if this occurs.     Plan follow up in 1 month to evaluate progress.    Continue with the plans with the ears nose and throat provider.  Great job with [employer] and I highly encourage you to continue with that.  Exercise is a great anxiety buster as well.  You are right to cut back on your caffeine use as that will help the  heart rate not it is fast or you feel as anxious.  Follow-up: Return in about 1 week (around 02/14/2020).   This visit occurred during the SARS-CoV-2 public health emergency.  Safety protocols were in place, including screening questions prior to the visit, additional usage of staff PPE, and extensive cleaning of exam room while observing appropriate contact time as indicated for disinfecting solutions.   Amedeo Kinsman, NP

## 2020-02-11 ENCOUNTER — Encounter: Payer: Self-pay | Admitting: Nurse Practitioner

## 2020-02-14 ENCOUNTER — Other Ambulatory Visit: Payer: Self-pay

## 2020-02-14 ENCOUNTER — Ambulatory Visit
Admission: RE | Admit: 2020-02-14 | Discharge: 2020-02-14 | Disposition: A | Discharge status: Home / Self Care | Payer: Managed Care, Other (non HMO) | Source: Ambulatory Visit | Attending: Physician Assistant | Admitting: Physician Assistant

## 2020-02-14 DIAGNOSIS — R221 Localized swelling, mass and lump, neck: Secondary | ICD-10-CM | POA: Insufficient documentation

## 2020-02-14 MED ORDER — IOHEXOL 300 MG/ML  SOLN
75.0000 mL | Freq: Once | INTRAMUSCULAR | Status: AC | PRN
  Administered 2020-02-14: 75 mL via INTRAVENOUS

## 2020-02-29 ENCOUNTER — Other Ambulatory Visit: Payer: Self-pay | Admitting: Physician Assistant

## 2020-02-29 ENCOUNTER — Other Ambulatory Visit (HOSPITAL_COMMUNITY): Payer: Self-pay | Admitting: Physician Assistant

## 2020-02-29 DIAGNOSIS — R599 Enlarged lymph nodes, unspecified: Secondary | ICD-10-CM

## 2020-02-29 DIAGNOSIS — R221 Localized swelling, mass and lump, neck: Secondary | ICD-10-CM

## 2020-03-03 ENCOUNTER — Other Ambulatory Visit: Payer: Self-pay

## 2020-03-07 ENCOUNTER — Ambulatory Visit (INDEPENDENT_AMBULATORY_CARE_PROVIDER_SITE_OTHER): Payer: Managed Care, Other (non HMO) | Admitting: Nurse Practitioner

## 2020-03-07 ENCOUNTER — Encounter: Payer: Self-pay | Admitting: Nurse Practitioner

## 2020-03-07 ENCOUNTER — Other Ambulatory Visit: Payer: Self-pay

## 2020-03-07 VITALS — BP 106/78 | HR 109 | Temp 98.3°F | Ht 60.0 in | Wt 167.0 lb

## 2020-03-07 DIAGNOSIS — F419 Anxiety disorder, unspecified: Secondary | ICD-10-CM | POA: Diagnosis not present

## 2020-03-07 DIAGNOSIS — Z23 Encounter for immunization: Secondary | ICD-10-CM

## 2020-03-07 MED ORDER — SERTRALINE HCL 50 MG PO TABS
ORAL_TABLET | ORAL | 1 refills | Status: DC
Start: 2020-03-07 — End: 2020-03-20

## 2020-03-07 NOTE — Patient Instructions (Addendum)
You are doing well on Zoloft and I refilled your medication.  Call if you have any questions or concerns.  Your lab work is up-to-date.  You are due for a Pap test.  Please schedule a CPE appointment in January for preventative care.  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   Tdap vaccine provided today.  Continue to work on healthy diet and increased exercise activity for weight management.  Monitor for any unusual weight gain on Zoloft.  It can be a side effect for minority of  patients.

## 2020-03-07 NOTE — Progress Notes (Signed)
Established Patient Office Visit  Subjective:  Patient ID: Ariana Powell, female    DOB: [birth year]  Age: 24 y.o. MRN: [id number]  CC:  Chief Complaint  Patient presents with  . Follow-up    anxiety    HPI Ariana Powell presents for a 1 month follow-up of new start Zoloft for anxiety.  Patient is doing very well.  She has noted a headache that was mild on the first couple weeks, that resolved.  She had no GI disturbance.  Her anxiety is 70% better.  She is able to ride in the car now without much difficulty.  She is very pleased with therapy.  She has a lymph node that is being monitored by ENT, and it is going down in size with antibiotic therapy.  He reports a plan to do an ultrasound next month.  Past Medical History:  Diagnosis Date  . Anxiety   . Frequent headaches     No past surgical history on file.  Family History  Problem Relation Age of Onset  . Cancer Maternal Grandmother   . Diabetes Maternal Grandmother   . Heart attack Maternal Grandmother   . Stroke Maternal Grandfather     Social History   Socioeconomic History  . Marital status: Single    Spouse name: Not on file  . Number of children: Not on file  . Years of education: Not on file  . Highest education level: Bachelor's degree (e.g., BA, AB, BS)  Occupational History  . Occupation: Nurse, adult  Tobacco Use  . Smoking status: Never Smoker  . Smokeless tobacco: Never Used  Vaping Use  . Vaping Use: Never used  Substance and Sexual Activity  . Alcohol use: Yes    Comment: socially 2-3 drinks every couple months  . Drug use: Never  . Sexual activity: Not Currently    Partners: Male  Other Topics Concern  . Not on file  Social History Narrative   [employer] college advisor   Social Determinants of Health   Financial Resource Strain:   . Difficulty of Paying Living Expenses: Not on file  Food Insecurity:   . Worried About Programme researcher, broadcasting/film/video in the Last Year: Not on file  .  Ran Out of Food in the Last Year: Not on file  Transportation Needs:   . Lack of Transportation (Medical): Not on file  . Lack of Transportation (Non-Medical): Not on file  Physical Activity:   . Days of Exercise per Week: Not on file  . Minutes of Exercise per Session: Not on file  Stress:   . Feeling of Stress : Not on file  Social Connections:   . Frequency of Communication with Friends and Family: Not on file  . Frequency of Social Gatherings with Friends and Family: Not on file  . Attends Religious Services: Not on file  . Active Member of Clubs or Organizations: Not on file  . Attends Banker Meetings: Not on file  . Marital Status: Not on file  Intimate Partner Violence:   . Fear of Current or Ex-Partner: Not on file  . Emotionally Abused: Not on file  . Physically Abused: Not on file  . Sexually Abused: Not on file    Outpatient Medications Prior to Visit  Medication Sig Dispense Refill  . sertraline (ZOLOFT) 50 MG tablet Take 1/2 tablet by mouth nightly and then 1 tablet nighty 90 tablet 0   No facility-administered medications prior to visit.    No  Known Allergies  Review of Systems  Constitutional: Negative for diaphoresis, fatigue and unexpected weight change.  HENT: Negative.   Respiratory: Negative for cough.   Cardiovascular: Negative for chest pain.  Gastrointestinal: Negative.   Psychiatric/Behavioral:       Improved anxiety. No depression concerns. No SI.      Objective:    Physical Exam Vitals reviewed.  Constitutional:      Appearance: She is obese.  Cardiovascular:     Rate and Rhythm: Normal rate and regular rhythm.     Pulses: Normal pulses.     Heart sounds: Normal heart sounds.  Pulmonary:     Effort: Pulmonary effort is normal.     Breath sounds: Normal breath sounds.  Neurological:     Mental Status: She is alert.  Psychiatric:        Mood and Affect: Mood normal.        Behavior: Behavior normal.        Thought  Content: Thought content normal.        Judgment: Judgment normal.     BP 106/78 (BP Location: Left Arm, Patient Position: Sitting, Cuff Size: Normal)   Pulse (!) 109   Temp 98.3 F (36.8 C) (Oral)   Ht 5' (1.524 m)   Wt 167 lb (75.8 kg)   SpO2 98%   BMI 32.61 kg/m  Wt Readings from Last 3 Encounters:  03/07/20 167 lb (75.8 kg)  02/07/20 172 lb (78 kg)  01/17/20 176 lb (79.8 kg)     Health Maintenance Due  Topic Date Due  . PAP-Cervical Cytology Screening  Never done  . PAP SMEAR-Modifier  Never done    There are no preventive care reminders to display for this patient.  Lab Results  Component Value Date   TSH 1.19 01/17/2020   Lab Results  Component Value Date   WBC 7.2 01/17/2020   HGB 13.4 01/17/2020   HCT 39.4 01/17/2020   MCV 89.9 01/17/2020   PLT 337.0 01/17/2020   Lab Results  Component Value Date   NA 138 01/17/2020   K 4.0 01/17/2020   CO2 26 01/17/2020   GLUCOSE 87 01/17/2020   BUN 11 01/17/2020   CREATININE 0.58 01/17/2020   BILITOT 0.7 01/17/2020   ALKPHOS 54 01/17/2020   AST 15 01/17/2020   ALT 13 01/17/2020   PROT 7.5 01/17/2020   ALBUMIN 4.5 01/17/2020   CALCIUM 9.9 01/17/2020   GFR 128.52 01/17/2020   Lab Results  Component Value Date   CHOL 150 01/17/2020   Lab Results  Component Value Date   HDL 45.40 01/17/2020   Lab Results  Component Value Date   LDLCALC 93 01/17/2020   Lab Results  Component Value Date   TRIG 56.0 01/17/2020   Lab Results  Component Value Date   CHOLHDL 3 01/17/2020   Lab Results  Component Value Date   HGBA1C 5.2 01/17/2020      Assessment & Plan:   Problem List Items Addressed This Visit      Other   Anxiety - Primary   Relevant Medications   sertraline (ZOLOFT) 50 MG tablet    Other Visit Diagnoses    Need for Tdap vaccination       Relevant Orders   Tdap vaccine greater than or equal to 7yo IM (Completed)      Meds ordered this encounter  Medications  . sertraline (ZOLOFT)  50 MG tablet    Sig: Take 1/2 tablet by mouth nightly  and then 1 tablet nighty    Dispense:  90 tablet    Refill:  1    Order Specific Question:   Supervising Provider    Answer:   Dale Nebo [[id number]]   You are doing well on Zoloft and I refilled your medication.  Call if you have any questions or concerns.  Your lab work is up-to-date.  You are due for a Pap test.  Please schedule a CPE appointment in January for preventative care.  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   Tdap vaccine provided today.  Continue to work on healthy diet and increased exercise activity for weight management.  Monitor for any unusual weight gain on Zoloft.  It can be a side effect for minority of  patients.  Follow-up: Return in about 3 months (around 06/07/2020).   This visit occurred during the SARS-CoV-2 public health emergency.  Safety protocols were in place, including screening questions prior to the visit, additional usage of staff PPE, and extensive cleaning of exam room while observing appropriate contact time as indicated for disinfecting solutions.   Amedeo Kinsman, NP

## 2020-03-09 ENCOUNTER — Other Ambulatory Visit: Payer: Self-pay

## 2020-03-09 ENCOUNTER — Ambulatory Visit
Admission: RE | Admit: 2020-03-09 | Discharge: 2020-03-09 | Disposition: A | Discharge status: Home / Self Care | Payer: Managed Care, Other (non HMO) | Source: Ambulatory Visit | Attending: Physician Assistant | Admitting: Physician Assistant

## 2020-03-09 DIAGNOSIS — R599 Enlarged lymph nodes, unspecified: Secondary | ICD-10-CM | POA: Diagnosis present

## 2020-03-09 DIAGNOSIS — R221 Localized swelling, mass and lump, neck: Secondary | ICD-10-CM | POA: Insufficient documentation

## 2020-03-13 ENCOUNTER — Encounter: Payer: Self-pay | Admitting: Nurse Practitioner

## 2020-03-13 ENCOUNTER — Other Ambulatory Visit: Payer: Self-pay

## 2020-03-13 ENCOUNTER — Telehealth (INDEPENDENT_AMBULATORY_CARE_PROVIDER_SITE_OTHER): Payer: Managed Care, Other (non HMO) | Admitting: Nurse Practitioner

## 2020-03-13 VITALS — Temp 103.6°F | Ht 60.0 in | Wt 160.0 lb

## 2020-03-13 DIAGNOSIS — R509 Fever, unspecified: Secondary | ICD-10-CM | POA: Insufficient documentation

## 2020-03-13 DIAGNOSIS — B349 Viral infection, unspecified: Secondary | ICD-10-CM | POA: Diagnosis not present

## 2020-03-13 DIAGNOSIS — R590 Localized enlarged lymph nodes: Secondary | ICD-10-CM

## 2020-03-13 DIAGNOSIS — Z20822 Contact with and (suspected) exposure to covid-19: Secondary | ICD-10-CM

## 2020-03-13 DIAGNOSIS — J069 Acute upper respiratory infection, unspecified: Secondary | ICD-10-CM | POA: Insufficient documentation

## 2020-03-13 NOTE — Progress Notes (Signed)
Virtual Visit via Virtual Note  This visit type was conducted due to national recommendations for restrictions regarding the COVID-19 pandemic (e.g. social distancing).  This format is felt to be most appropriate for this patient at this time.  All issues noted in this document were discussed and addressed.  No physical exam was performed (except for noted visual exam findings with Video Visits).   I connected with@ on 03/14/20 at  4:00 PM EDT by a video enabled telemedicine application or telephone and verified that I am speaking with the correct person using two identifiers. Location patient: home Location provider: work or home office Persons participating in the virtual visit: patient, provider  I discussed the limitations, risks, security and privacy concerns of performing an evaluation and management service by telephone and the availability of in person appointments. I also discussed with the patient that there may be a patient responsible charge related to this service. The patient expressed understanding and agreed to proceed.   Reason for visit: Fever of 103 for 2 days, chills body aches.  Did a Covid test waiting on results.  HPI: Onset Saturday morning woke up with a fever of 100.2, took ibuprofen.  Yesterday she had a fever of 103.2, took Tylenol and Motrin with fever breaking.  She did have chills at night, mild headache, body aches, fatigue, Slight intermittent discomfort in the lower center of the chest and points to epigastric area.  No nausea vomiting and eating normally.  No pain with deep breath.  No shortness of breath or DOE.  No cough or wheezing.  She does not have any head or chest congestion.No pain in the chest and leaning forward. Moderna Covid vaccines completed in March.   ROS: See pertinent positives and negatives per HPI.  Past Medical History:  Diagnosis Date  . Anxiety   . Frequent headaches     History reviewed. No pertinent surgical history.  Family  History  Problem Relation Age of Onset  . Cancer Maternal Grandmother   . Diabetes Maternal Grandmother   . Heart attack Maternal Grandmother   . Stroke Maternal Grandfather     SOCIAL HX: No tobacco.    Current Outpatient Medications:  .  sertraline (ZOLOFT) 50 MG tablet, Take 1/2 tablet by mouth nightly and then 1 tablet nighty, Disp: 90 tablet, Rfl: 1  EXAM:  VITALS per patient if applicable:103.6. wt 160 lbs.   GENERAL: alert, oriented, appears mildly fatigued, but  well appearing and in no acute distress. She is cheerful, smiling and talking easily. No nasal congested tone to her voice.   HEENT: atraumatic, conjunctiva clear, no obvious abnormalities on inspection of external nose and ears  NECK: normal movements of the head and neck  LUNGS: on inspection no signs of respiratory distress, breathing rate appears normal, no obvious gross SOB, gasping or wheezing, no cough  CV: no obvious cyanosis  MS: moves all visible extremities without noticeable abnormality  PSYCH/NEURO: pleasant and cooperative,  no obvious depression or anxiety, speech and thought processing grossly intact  ASSESSMENT AND PLAN:  Discussed the following assessment and plan:  Fever and chills - Plan: POCT Influenza A/B, POCT rapid strep A  Viral illness  Suspected COVID-19 virus infection  No problem-specific Assessment & Plan notes found for this encounter.   Await Covid test. She reports high fever today. Tylenol brings it down to normal .She is well appearing on video- with mild fatigue.  Eating and drinking WNL.   Patient advised:  Come in  for influenza and Strep test- behind the building drive through..  Advised patient on supportive measures:  Get rest, drink plenty of fluids, and use tylenol or Motrin as needed for pain. Try taking the Tylenol to lower fever, tepid baths, and hydrate very well. Follow Tylenol directions.   Motrin may be irritating your stomach. You may try Pepcid AC  for stomach ache- generic famotidine.   Call the office  daily for symptom update.  Follow up if fever >101, if symptoms worsen or if symptoms are not improved in 3 days.   Self isolate until you know you do not have something contagious  I discussed the assessment and treatment plan with the patient. The patient was provided an opportunity to ask questions and all were answered. The patient agreed with the plan and demonstrated an understanding of the instructions.   The patient was advised to call back or seek an in-person evaluation if the symptoms worsen or if the condition fails to improve as anticipated.  Amedeo Kinsman, NP Adult Nurse Practitioner [facility] [employer] [phone]

## 2020-03-13 NOTE — Patient Instructions (Addendum)
Await Covid test.   Come in for influenza and Strep test- behind the building drive through..  Advised patient on supportive measures:  Get rest, drink plenty of fluids, and use tylenol or Motrin as needed for pain. Try taking the Tylenol to lower fever, tepid baths, and hydrate very well. Follow Tylenol directions.   Motrin may be irritating your stomach. You may try Pepcid AC for stomach ache- generic famotidine.   Call the office  daily for symptom update.  Follow up if fever >101, if symptoms worsen or if symptoms are not improved in 3 days.   Self isolate until you know you do not have something contagious.     Fever, Adult     A fever is an increase in the body's temperature. It is usually defined as a temperature of 100.45F (38C) or higher. Brief mild or moderate fevers generally have no long-term effects, and they often do not need treatment. Moderate or high fevers may make you feel uncomfortable and can sometimes be a sign of a serious illness or disease. The sweating that may occur with repeated or prolonged fever may also cause a loss of fluid in the body (dehydration). Fever is confirmed by taking a temperature with a thermometer. A measured temperature can vary with:  Age.  Time of day.  Where in the body you take the temperature. Readings may vary if you place the thermometer: ? In the mouth (oral). ? In the rectum (rectal). ? In the ear (tympanic). ? Under the arm (axillary). ? On the forehead (temporal). Follow these instructions at home: Medicines  Take over-the counter and prescription medicines only as told by your health care provider. Follow the dosing instructions carefully.  If you were prescribed an antibiotic medicine, take it as told by your health care provider. Do not stop taking the antibiotic even if you start to feel better. General instructions  Watch your condition for any changes. Let your health care provider know about them.  Rest as  needed.  Drink enough fluid to keep your urine pale yellow. This helps to prevent dehydration.  Sponge yourself or bathe with room-temperature water to help reduce your body temperature as needed. Do not use ice water.  Do not use too many blankets or wear clothes that are too heavy.  If your fever may be caused by an infection that spreads from person to person (is contagious), such as a cold or the flu, you should stay home from work and public gatherings for at least 24 hours after your fever is gone. Your fever should be gone without the need to use medicines. Contact a health care provider if:  You vomit.  You cannot eat or drink without vomiting.  You have diarrhea.  You have pain when you urinate.  Your symptoms do not improve with treatment.  You develop new symptoms.  You develop excessive weakness. Get help right away if:  You have shortness of breath or have trouble breathing.  You are dizzy or you faint.  You are disoriented or confused.  You develop signs of dehydration, such as: ? Dark urine, very little urine, or no urine. ? Cracked lips. ? Dry mouth. ? Sunken eyes. ? Sleepiness. ? Weakness.  You develop severe pain in your abdomen.  You have persistent vomiting or diarrhea.  You develop a skin rash.  Your symptoms suddenly get worse. Summary  A fever is an increase in the body's temperature. It is usually defined as a temperature  of 100.8F (38C) or higher. Moderate or high fevers can sometimes be a sign of a serious illness or disease. The sweating that may occur with repeated or prolonged fever may also cause dehydration.  Pay attention to any changes in your symptoms and contact your health care provider if your symptoms do not improve with treatment.  Take over-the counter and prescription medicines only as told by your health care provider. Follow the dosing instructions carefully.  If your fever is from an infection that may be contagious,  such as cold or flu, you should stay home from work and public gatherings for at least 24 hours after your fever is gone. Your fever should be gone without the need to use medicines.  Get help right away if you develop signs of dehydration, such as dark urine, cracked lips, dry mouth, sunken eyes, sleepiness, or weakness. This information is not intended to replace advice given to you by your health care provider. Make sure you discuss any questions you have with your health care provider. Document Revised: 10/27/2017 Document Reviewed: 10/27/2017 Elsevier Patient Education  2020 [employer].

## 2020-03-14 ENCOUNTER — Ambulatory Visit
Admission: RE | Admit: 2020-03-14 | Discharge: 2020-03-14 | Disposition: A | Discharge status: Home / Self Care | Payer: Managed Care, Other (non HMO) | Source: Ambulatory Visit | Attending: Nurse Practitioner | Admitting: Nurse Practitioner

## 2020-03-14 ENCOUNTER — Other Ambulatory Visit: Payer: Self-pay

## 2020-03-14 ENCOUNTER — Other Ambulatory Visit
Admission: RE | Admit: 2020-03-14 | Discharge: 2020-03-14 | Disposition: A | Discharge status: Home / Self Care | Payer: Managed Care, Other (non HMO) | Source: Ambulatory Visit | Attending: Family Medicine | Admitting: Family Medicine

## 2020-03-14 ENCOUNTER — Other Ambulatory Visit (INDEPENDENT_AMBULATORY_CARE_PROVIDER_SITE_OTHER): Payer: Managed Care, Other (non HMO)

## 2020-03-14 DIAGNOSIS — B349 Viral infection, unspecified: Secondary | ICD-10-CM | POA: Insufficient documentation

## 2020-03-14 DIAGNOSIS — Z20822 Contact with and (suspected) exposure to covid-19: Secondary | ICD-10-CM | POA: Insufficient documentation

## 2020-03-14 DIAGNOSIS — R509 Fever, unspecified: Secondary | ICD-10-CM

## 2020-03-14 LAB — POCT RAPID STREP A (OFFICE): Rapid Strep A Screen: NEGATIVE

## 2020-03-14 LAB — POCT INFLUENZA A/B
Influenza A, POC: NEGATIVE
Influenza B, POC: NEGATIVE

## 2020-03-14 NOTE — Telephone Encounter (Signed)
I called patient for a symptom update.  She reports she still woke up today with 103.6 temperature.  She takes Tylenol it goes down to normal within 1 to 2 hours.  She took Tylenol again at 4:00 for 102 fever, and now an hour later it is 98.7.  She has a little more fatigue today.  No headache at all today.  No body aches, sore throat, neck pain,  very slight left earache started today.  No cough, shortness of breath, chest pain, but with further discussion, she reports that she has very slight chest pressure in the center of her chest- onset yesterday.  She has no shortness of breath or wheezing.  No nausea, vomiting, diarrhea.  No dysuria.  She tested negative for Covid, influenza, and strep.  Plan: She developed fever 3 days ago. I advised [facility] for in person evaluation and lab work.  Patient declines.  She says she does not drive at night.  She will go to [facility] and have blood work done tonight.  Therefore,  I have ordered stat CBC, C-Met, UA, urine culture, blood culture x 2, and obtain chest x-ray.  No risk of pregnancy as she has had no exposure and last menstrual period was a few days ago. Further recommendations pending results. Case d/w Dr. Scott in collaboration of care.  

## 2020-03-14 NOTE — Telephone Encounter (Signed)
-----   Message from Wilburn Cornelia, [place] sent at 03/14/2020  3:57 PM EDT ----- Patient aware of results. Covid test was negative as well. Patient is still having some left ear pain, fever (still 103.5 today) and chills from time to time.

## 2020-03-15 ENCOUNTER — Telehealth: Payer: Self-pay | Admitting: Nurse Practitioner

## 2020-03-15 ENCOUNTER — Other Ambulatory Visit
Admission: RE | Admit: 2020-03-15 | Discharge: 2020-03-15 | Disposition: A | Discharge status: Home / Self Care | Payer: Managed Care, Other (non HMO) | Attending: Nurse Practitioner | Admitting: Nurse Practitioner

## 2020-03-15 DIAGNOSIS — R509 Fever, unspecified: Secondary | ICD-10-CM | POA: Diagnosis present

## 2020-03-15 LAB — URINALYSIS, ROUTINE W REFLEX MICROSCOPIC
Bilirubin Urine: NEGATIVE
Glucose, UA: NEGATIVE mg/dL
Ketones, ur: 20 mg/dL — AB
Nitrite: NEGATIVE
Protein, ur: 30 mg/dL — AB
Specific Gravity, Urine: 1.018 (ref 1.005–1.030)
WBC, UA: >50 WBC/hpf — ABNORMAL HIGH (ref 0–5)
pH: 6 (ref 5.0–8.0)

## 2020-03-15 LAB — COMPREHENSIVE METABOLIC PANEL
ALT: 16 U/L (ref 0–44)
AST: 21 U/L (ref 15–41)
Albumin: 4.1 g/dL (ref 3.5–5.0)
Alkaline Phosphatase: 50 U/L (ref 38–126)
Anion gap: 10 (ref 5–15)
BUN: 9 mg/dL (ref 6–20)
CO2: 27 mmol/L (ref 22–32)
Calcium: 8.8 mg/dL — ABNORMAL LOW (ref 8.9–10.3)
Chloride: 98 mmol/L (ref 98–111)
Creatinine, Ser: 0.78 mg/dL (ref 0.44–1.00)
GFR, Estimated: >60 mL/min (ref >60)
Glucose, Bld: 101 mg/dL — ABNORMAL HIGH (ref 70–99)
Potassium: 3.2 mmol/L — ABNORMAL LOW (ref 3.5–5.1)
Sodium: 135 mmol/L (ref 135–145)
Total Bilirubin: 0.8 mg/dL (ref 0.3–1.2)
Total Protein: 7.8 g/dL (ref 6.5–8.1)

## 2020-03-15 LAB — CBC WITH DIFFERENTIAL/PLATELET
Abs Immature Granulocytes: 0.02 10*3/uL (ref 0.00–0.07)
Basophils Absolute: 0 10*3/uL (ref 0.0–0.1)
Basophils Relative: 0 %
Eosinophils Absolute: 0 10*3/uL (ref 0.0–0.5)
Eosinophils Relative: 0 %
HCT: 36.2 % (ref 36.0–46.0)
Hemoglobin: 12.3 g/dL (ref 12.0–15.0)
Immature Granulocytes: 0 %
Lymphocytes Relative: 38 %
Lymphs Abs: 2.1 10*3/uL (ref 0.7–4.0)
MCH: 29.2 pg (ref 26.0–34.0)
MCHC: 34 g/dL (ref 30.0–36.0)
MCV: 86 fL (ref 80.0–100.0)
Monocytes Absolute: 0.5 10*3/uL (ref 0.1–1.0)
Monocytes Relative: 9 %
Neutro Abs: 2.9 10*3/uL (ref 1.7–7.7)
Neutrophils Relative %: 53 %
Platelets: 206 10*3/uL (ref 150–400)
RBC: 4.21 MIL/uL (ref 3.87–5.11)
RDW: 12.6 % (ref 11.5–15.5)
WBC: 5.5 10*3/uL (ref 4.0–10.5)
nRBC: 0 % (ref 0.0–0.2)

## 2020-03-15 MED ORDER — CIPROFLOXACIN HCL 500 MG PO TABS: 500.0000 mg | ORAL_TABLET | Freq: Two times a day (BID) | ORAL | 0 refills | Status: DC

## 2020-03-15 MED ORDER — SACCHAROMYCES BOULARDII 250 MG PO CAPS: 250.0000 mg | ORAL_CAPSULE | Freq: Two times a day (BID) | ORAL | 0 refills | Status: DC

## 2020-03-15 MED ORDER — POTASSIUM CHLORIDE ER 10 MEQ PO TBCR: 10.0000 meq | EXTENDED_RELEASE_TABLET | Freq: Every day | ORAL | 0 refills | Status: DC

## 2020-03-15 NOTE — Telephone Encounter (Signed)
I called and left a HIPAA compliant message on her answering machine to send me a MY Chart message update. I also sent a MY chart message to inquire about her WIC outcome findings today.

## 2020-03-15 NOTE — Telephone Encounter (Addendum)
I spoke with Ariana Powell this morning. Temp 101. No new symptoms. She needs to have labs done at [facility].  I will resubmit those as they were not visible to the lab last night.

## 2020-03-15 NOTE — Addendum Note (Signed)
Addended by: Amedeo Kinsman A on: 03/15/2020 01:06 PM   Modules accepted: Orders

## 2020-03-15 NOTE — Addendum Note (Signed)
Addended by: Amedeo Kinsman A on: 03/15/2020 12:35 PM   Modules accepted: Orders

## 2020-03-16 ENCOUNTER — Other Ambulatory Visit: Payer: Self-pay | Admitting: Nurse Practitioner

## 2020-03-16 LAB — URINE CULTURE: Culture: <10000 — AB

## 2020-03-16 NOTE — Telephone Encounter (Signed)
I spoke to Ariana Powell today.  Her urine culture returned negative.  The UA was positive for leukocytes and blood.  She was spotting from her menstrual cycle.  She has no dysuria urgency frequency.  She has never had a UTI.    Her blood culture x1 was negative although the comment was that it was under filled.  Patient felt a little lightheaded when she was getting it done so they stopped early.  She did have a mildly low potassium and is on potassium supplement.  She has had no nausea or vomiting or diarrhea but she admits that she has not really been eating or drinking because she has not been thinking about it.  She did eat pasta for lunch.  She knows she needs to drink more but has not done so yet.  Her Zoloft has made her little constipated.  No diarrhea.  Her symptoms today are just mild fatigue, a little lightheaded but she thinks is dehydration, and a fever of 101 without Tylenol. She has no symptoms of infection such as headache, earache sore throat, URI, cough or abd pain.  She reports those systems were evaluated in the walk-in clinic and were clear.  Her chest x-ray is normal.  CBC is normal.  She was tested for influenza, Covid, and strep with throat swabs negative.  She does not go outdoors, and is unlikely to have any type of tick exposure.  We can find no sign of infection.  She was started on Zoloft last month.  She denies any serotonin symptoms other than the fever.   PLAN:  We will have her stop Zoloft and see if the fever improves.  She is encouraged to eat normally, hydrate super well, continue to monitor the fever, and that is monitored 3-4 times a day and keep a chart. She does not need Cipro-no UTI. F/up  video visit on Monday.

## 2020-03-20 ENCOUNTER — Other Ambulatory Visit: Payer: Self-pay

## 2020-03-20 ENCOUNTER — Encounter: Payer: Self-pay | Admitting: Nurse Practitioner

## 2020-03-20 ENCOUNTER — Telehealth (INDEPENDENT_AMBULATORY_CARE_PROVIDER_SITE_OTHER): Payer: Managed Care, Other (non HMO) | Admitting: Nurse Practitioner

## 2020-03-20 VITALS — Temp 98.6°F | Ht 60.0 in | Wt 160.0 lb

## 2020-03-20 DIAGNOSIS — R509 Fever, unspecified: Secondary | ICD-10-CM | POA: Diagnosis not present

## 2020-03-20 DIAGNOSIS — G2579 Other drug induced movement disorders: Secondary | ICD-10-CM | POA: Diagnosis not present

## 2020-03-20 DIAGNOSIS — R502 Drug induced fever: Secondary | ICD-10-CM | POA: Diagnosis not present

## 2020-03-20 DIAGNOSIS — G9081 Serotonin syndrome: Secondary | ICD-10-CM

## 2020-03-20 LAB — CULTURE, BLOOD (ROUTINE X 2): Culture: NO GROWTH

## 2020-03-20 NOTE — Patient Instructions (Addendum)
You seem to have had a drug fever or even a rise in the sertraline - called serotonin syndrome.  I have listed that class as an allergy and you should not take this class of medication called selective serotonin reuptake inhibitors (SSRIs) or serotonin and norepinephrine reuptake inhibitors (SNRIs).  Please call back if you develop a fever again. Or, if your anxiety returns. Non-medication management  of anxiety-options-see below. Also, includes talking with behavioral therapist.   Follow up in 3 months for recheck.   Managing Anxiety, Adult After being diagnosed with an anxiety disorder, you may be relieved to know why you have felt or behaved a certain way. You may also feel overwhelmed about the treatment ahead and what it will mean for your life. With care and support, you can manage this condition and recover from it. How to manage lifestyle changes Managing stress and anxiety  Stress is your body's reaction to life changes and events, both good and bad. Most stress will last just a few hours, but stress can be ongoing and can lead to more than just stress. Although stress can play a major role in anxiety, it is not the same as anxiety. Stress is usually caused by something external, such as a deadline, test, or competition. Stress normally passes after the triggering event has ended.  Anxiety is caused by something internal, such as imagining a terrible outcome or worrying that something will go wrong that will devastate you. Anxiety often does not go away even after the triggering event is over, and it can become long-term (chronic) worry. It is important to understand the differences between stress and anxiety and to manage your stress effectively so that it does not lead to an anxious response. Talk with your health care provider or a counselor to learn more about reducing anxiety and stress. He or she may suggest tension reduction techniques, such as:  Music therapy. This can include  creating or listening to music that you enjoy and that inspires you.  Mindfulness-based meditation. This involves being aware of your normal breaths while not trying to control your breathing. It can be done while sitting or walking.  Centering prayer. This involves focusing on a word, phrase, or sacred image that means something to you and brings you peace.  Deep breathing. To do this, expand your stomach and inhale slowly through your nose. Hold your breath for 3-5 seconds. Then exhale slowly, letting your stomach muscles relax.  Self-talk. This involves identifying thought patterns that lead to anxiety reactions and changing those patterns.  Muscle relaxation. This involves tensing muscles and then relaxing them. Choose a tension reduction technique that suits your lifestyle and personality. These techniques take time and practice. Set aside 5-15 minutes a day to do them. Therapists can offer counseling and training in these techniques. The training to help with anxiety may be covered by some insurance plans. Other things you can do to manage stress and anxiety include:  Keeping a stress/anxiety diary. This can help you learn what triggers your reaction and then learn ways to manage your response.  Thinking about how you react to certain situations. You may not be able to control everything, but you can control your response.  Making time for activities that help you relax and not feeling guilty about spending your time in this way.  Visual imagery and yoga can help you stay calm and relax.  Medicines Medicines can help ease symptoms. Medicines for anxiety include:  Anti-anxiety drugs.  Antidepressants.  Medicines are often used as a primary treatment for anxiety disorder. Medicines will be prescribed by a health care provider. When used together, medicines, psychotherapy, and tension reduction techniques may be the most effective treatment. Relationships Relationships can play a big  part in helping you recover. Try to spend more time connecting with trusted friends and family members. Consider going to couples counseling, taking family education classes, or going to family therapy. Therapy can help you and others better understand your condition. How to recognize changes in your anxiety Everyone responds differently to treatment for anxiety. Recovery from anxiety happens when symptoms decrease and stop interfering with your daily activities at home or work. This may mean that you will start to:  Have better concentration and focus. Worry will interfere less in your daily thinking.  Sleep better.  Be less irritable.  Have more energy.  Have improved memory. It is important to recognize when your condition is getting worse. Contact your health care provider if your symptoms interfere with home or work and you feel like your condition is not improving. Follow these instructions at home: Activity  Exercise. Most adults should do the following: ? Exercise for at least 150 minutes each week. The exercise should increase your heart rate and make you sweat (moderate-intensity exercise). ? Strengthening exercises at least twice a week.  Get the right amount and quality of sleep. Most adults need 7-9 hours of sleep each night. Lifestyle   Eat a healthy diet that includes plenty of vegetables, fruits, whole grains, low-fat dairy products, and lean protein. Do not eat a lot of foods that are high in solid fats, added sugars, or salt.  Make choices that simplify your life.  Do not use any products that contain nicotine or tobacco, such as cigarettes, e-cigarettes, and chewing tobacco. If you need help quitting, ask your health care provider.  Avoid caffeine, alcohol, and certain over-the-counter cold medicines. These may make you feel worse. Ask your pharmacist which medicines to avoid. General instructions  Take over-the-counter and prescription medicines only as told by  your health care provider.  Keep all follow-up visits as told by your health care provider. This is important. Where to find support You can get help and support from these sources:  Self-help groups.  Online and [employer] organizations.  A trusted spiritual leader.  Couples counseling.  Family education classes.  Family therapy. Where to find more information You may find that joining a support group helps you deal with your anxiety. The following sources can help you locate counselors or support groups near you:  Mental Health America: www.mentalhealthamerica.net  Anxiety and Depression Association of [place] (ADAA): ProgramCam.dewww.adaa.org  [employer] on Mental Illness (NAMI): www.nami.org Contact a health care provider if you:  Have a hard time staying focused or finishing daily tasks.  Spend many hours a day feeling worried about everyday life.  Become exhausted by worry.  Start to have headaches, feel tense, or have nausea.  Urinate more than normal.  Have diarrhea. Get help right away if you have:  A racing heart and shortness of breath.  Thoughts of hurting yourself or others. If you ever feel like you may hurt yourself or others, or have thoughts about taking your own life, get help right away. You can go to your nearest emergency department or call:  Your local emergency services (911 in the [place]  A suicide crisis helpline, such as the National Suicide Prevention Lifeline at 91423836021-808-551-4668. This is open 24 hours a  day. Summary  Taking steps to learn and use tension reduction techniques can help calm you and help prevent triggering an anxiety reaction.  When used together, medicines, psychotherapy, and tension reduction techniques may be the most effective treatment.  Family, friends, and partners can play a big part in helping you recover from an anxiety disorder. This information is not intended to replace advice given to you by your health care  provider. Make sure you discuss any questions you have with your health care provider. Document Revised: 10/13/2018 Document Reviewed: 10/13/2018 Elsevier Patient Education  2020 Elsevier Inc.  Fever, Adult     A fever is an increase in the body's temperature. It is usually defined as a temperature of 100.46F (38C) or higher. Brief mild or moderate fevers generally have no long-term effects, and they often do not need treatment. Moderate or high fevers may make you feel uncomfortable and can sometimes be a sign of a serious illness or disease. The sweating that may occur with repeated or prolonged fever may also cause a loss of fluid in the body (dehydration). Fever is confirmed by taking a temperature with a thermometer. A measured temperature can vary with:  Age.  Time of day.  Where in the body you take the temperature. Readings may vary if you place the thermometer: ? In the mouth (oral). ? In the rectum (rectal). ? In the ear (tympanic). ? Under the arm (axillary). ? On the forehead (temporal). Follow these instructions at home: Medicines  Take over-the counter and prescription medicines only as told by your health care provider. Follow the dosing instructions carefully.  If you were prescribed an antibiotic medicine, take it as told by your health care provider. Do not stop taking the antibiotic even if you start to feel better. General instructions  Watch your condition for any changes. Let your health care provider know about them.  Rest as needed.  Drink enough fluid to keep your urine pale yellow. This helps to prevent dehydration.  Sponge yourself or bathe with room-temperature water to help reduce your body temperature as needed. Do not use ice water.  Do not use too many blankets or wear clothes that are too heavy.  If your fever may be caused by an infection that spreads from person to person (is contagious), such as a cold or the flu, you should stay home from  work and public gatherings for at least 24 hours after your fever is gone. Your fever should be gone without the need to use medicines. Contact a health care provider if:  You vomit.  You cannot eat or drink without vomiting.  You have diarrhea.  You have pain when you urinate.  Your symptoms do not improve with treatment.  You develop new symptoms.  You develop excessive weakness. Get help right away if:  You have shortness of breath or have trouble breathing.  You are dizzy or you faint.  You are disoriented or confused.  You develop signs of dehydration, such as: ? Dark urine, very little urine, or no urine. ? Cracked lips. ? Dry mouth. ? Sunken eyes. ? Sleepiness. ? Weakness.  You develop severe pain in your abdomen.  You have persistent vomiting or diarrhea.  You develop a skin rash.  Your symptoms suddenly get worse. Summary  A fever is an increase in the body's temperature. It is usually defined as a temperature of 100.46F (38C) or higher. Moderate or high fevers can sometimes be a sign of a serious  illness or disease. The sweating that may occur with repeated or prolonged fever may also cause dehydration.  Pay attention to any changes in your symptoms and contact your health care provider if your symptoms do not improve with treatment.  Take over-the counter and prescription medicines only as told by your health care provider. Follow the dosing instructions carefully.  If your fever is from an infection that may be contagious, such as cold or flu, you should stay home from work and public gatherings for at least 24 hours after your fever is gone. Your fever should be gone without the need to use medicines.  Get help right away if you develop signs of dehydration, such as dark urine, cracked lips, dry mouth, sunken eyes, sleepiness, or weakness. This information is not intended to replace advice given to you by your health care provider. Make sure you discuss  any questions you have with your health care provider. Document Revised: 10/27/2017 Document Reviewed: 10/27/2017 Elsevier Patient Education  2020 [employer].

## 2020-03-20 NOTE — Progress Notes (Signed)
Virtual Visit via telephone Note  This visit type was conducted due to national recommendations for restrictions regarding the COVID-19 pandemic (e.g. social distancing).  This format is felt to be most appropriate for this patient at this time.  All issues noted in this document were discussed and addressed.  No physical exam was performed (except for noted visual exam findings with Video Visits).   I connected with@ on 03/20/20 at  4:30 PM EDT by a video enabled telemedicine application or telephone and verified that I am speaking with the correct person using two identifiers. Location patient: home Location provider: work or home office Persons participating in the virtual visit: patient, provider  I discussed the limitations, risks, security and privacy concerns of performing an evaluation and management service by telephone and the availability of in person appointments. I also discussed with the patient that there may be a patient responsible charge related to this service. The patient expressed understanding and agreed to proceed.  Interactive audio and video telecommunications were attempted between this provider and patient, however failed, due to the clinic having technical difficulties with [employer].  We continued and completed visit with audio only.   Reason for visit: F/up for FUO and discontinuation sertraline with rapid resolution of fever. Suspect serotonin  syndrome- now resolved.   HPI: Patient presented with high fevers 103.6, some fatigue, decreased appetite, but no localizing site for infection or typical URI symptoms. She was seen in person in [facility] and Dx with UTI for suspicious UA- and started on Cipro- stopped after culture negative. Blood cultures negative, urine culture negative, chest x-ray negative, and strep throat, influenza, Covid test negative. Mild low potassium supplemented.  She was started on sertraline last month (03/07/20), and that was discontinued.  Suspected  serotonin syndrome or drug fever.  She is feeling well now and fevers resolved the day after stopping the serotonergic medication.She is back to work and feels back to normal.  She is not taking any medication for anxiety, but she feels fines. She does not feel anxious and would like to hold off on any further medication at this time.  No history of depression or self-harm.     ROS: See pertinent positives and negatives per HPI.  She had no confusion, agitation or restlessness, diarrhea, vomiting. She did have fever, slight HA, nausea which are also sx of serotonin syndrome.   Past Medical History:  Diagnosis Date  . Anxiety   . Frequent headaches     History reviewed. No pertinent surgical history.  Family History  Problem Relation Age of Onset  . Cancer Maternal Grandmother   . Diabetes Maternal Grandmother   . Heart attack Maternal Grandmother   . Stroke Maternal Grandfather     SOCIAL HX: No tobacco or alcohol    Current Outpatient Medications:  .  potassium chloride (KLOR-CON) 10 MEQ tablet, TAKE 1 TABLET(10 MEQ) BY MOUTH DAILY FOR 4 DAYS, Disp: 4 tablet, Rfl: 0 .  saccharomyces boulardii (FLORASTOR) 250 MG capsule, Take 1 capsule (250 mg total) by mouth 2 (two) times daily for 14 days., Disp: 28 capsule, Rfl: 0  EXAM:  VITALS per patient if applicable: Temp 98.6 weight is 160  GENERAL: Sounds alert, oriented, and in no acute distress  LUNGS: Sounds on inspection no signs of respiratory distress, breathing rate appears normal, no obvious gross SOB, gasping or wheezing  PSYCH/NEURO: pleasant and cooperative, no obvious depression or anxiety, speech and thought processing grossly intact  ASSESSMENT AND PLAN:  Discussed the following assessment and plan:  FUO (fever of unknown origin)  Serotonin syndrome  Drug induced fever  No problem-specific Assessment & Plan notes found for this encounter.  Pt  had a drug fever or even serotonin syndrome after new start and  titration of sertraline 03/07/20. Fever started 03/13/2020.   Pt advised:  You seem to have had a drug fever or even a rise in the sertraline - called serotonin syndrome.  I have listed that class as an allergy and you should not take this class of medication called selective serotonin reuptake inhibitors (SSRIs) or serotonin and norepinephrine reuptake inhibitors (SNRIs).  Please call back if you develop a fever again. Or, if your anxiety returns. Non-medication management  of anxiety-options-see below. Also, includes talking with behavioral therapist.   Follow up in 3 months for recheck.   I discussed the assessment and treatment plan with the patient. The patient was provided an opportunity to ask questions and all were answered. The patient agreed with the plan and demonstrated an understanding of the instructions.   The patient was advised to call back or seek an in-person evaluation if the symptoms worsen or if the condition fails to improve as anticipated.  I provided 6 minutes of  telephone time during this encounter.  Amedeo Kinsman, NP Adult Nurse Practitioner [facility] [employer] [phone]

## 2020-03-21 ENCOUNTER — Encounter: Payer: Self-pay | Admitting: Nurse Practitioner

## 2020-03-21 NOTE — Addendum Note (Signed)
Addended by: Wendie Simmer B on: 03/21/2020 10:22 AM   Modules accepted: Orders

## 2020-03-21 NOTE — Telephone Encounter (Signed)
Internet went down in the middle of virtual visit yesterday but patient was seen via telephone visit

## 2020-06-07 ENCOUNTER — Ambulatory Visit: Payer: Managed Care, Other (non HMO) | Admitting: Nurse Practitioner

## 2020-10-02 IMAGING — CT CT NECK W/ CM
5 series · 16 of 33 positions shown, 18 images · IV contrast (omnipaque)
Comparison: None.

CLINICAL DATA: Left-sided neck mass for 1 month which has resolved
but to others have formed in its place.

EXAM:
CT NECK WITH CONTRAST
TECHNIQUE: Multidetector CT imaging of the neck was performed using the
standard protocol following the bolus administration of intravenous
contrast.
CONTRAST:  75mL OMNIPAQUE IOHEXOL 300 MG/ML  SOLN

[Series 2: axial neck neck (person_name) 2.00 · axial · 0.46mm/px · z∈[-704,-624]mm · 2 of 120 slices shown]
[im 40/120  bone]
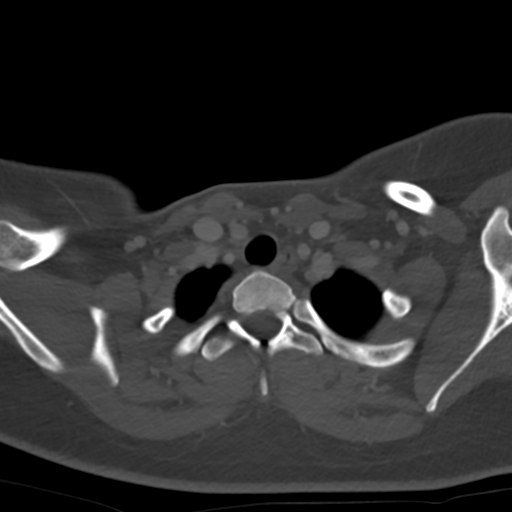
[im 80/120  bone]
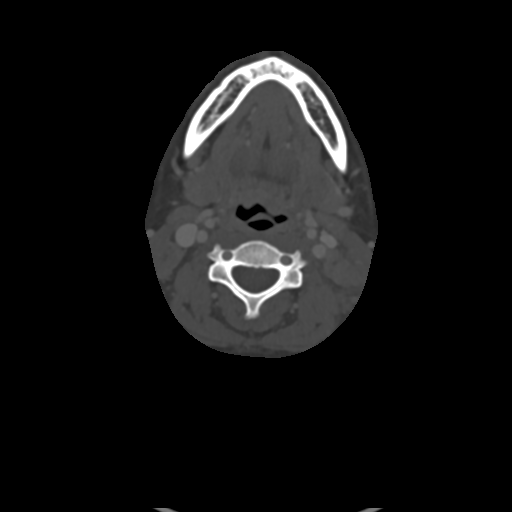

[Series 3: axial bone neck 2.00 · axial · 0.46mm/px · z∈[-724,-604]mm · 3 of 120 slices shown, 4 images]
[im 30/120  soft-tissue]
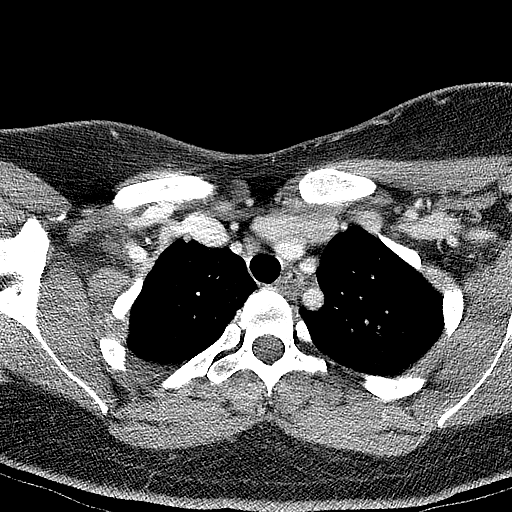
[im 30/120  bone]
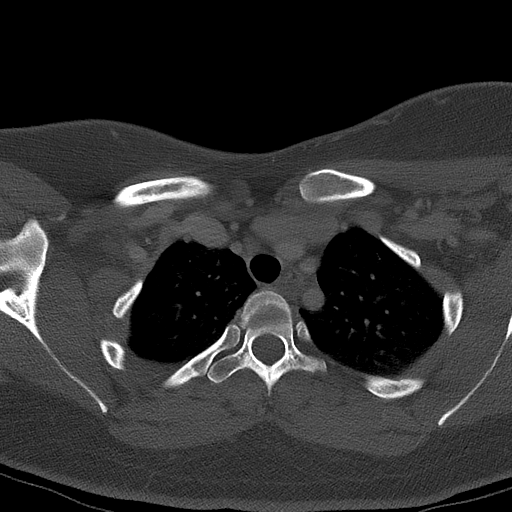
[im 60/120  bone]
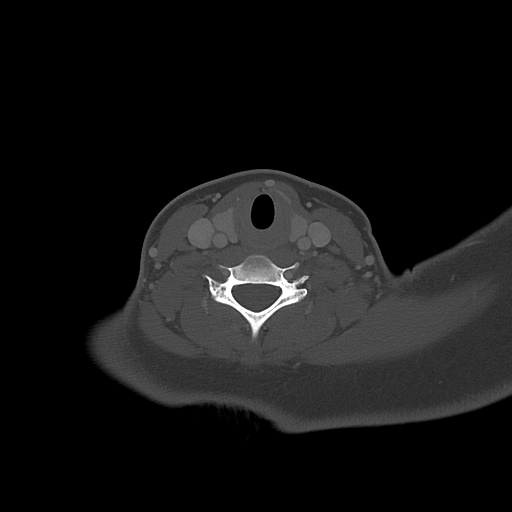
[im 90/120  bone]
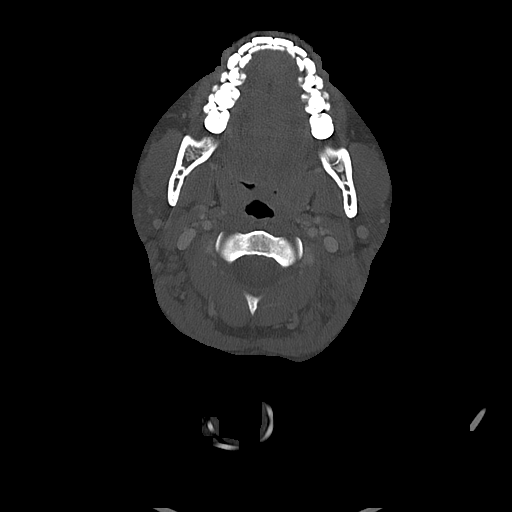

[Series 4: coronal neck neck (person_name) 2.00 cor · coronal · 0.47mm/px · 3 of 120 slices shown]
[im 24/120  bone]
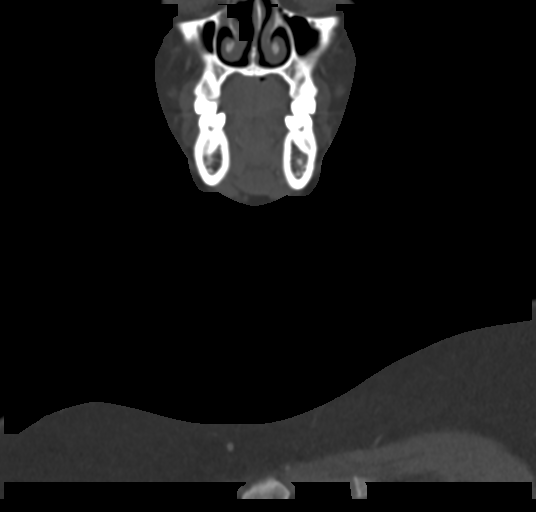
[im 48/120  bone]
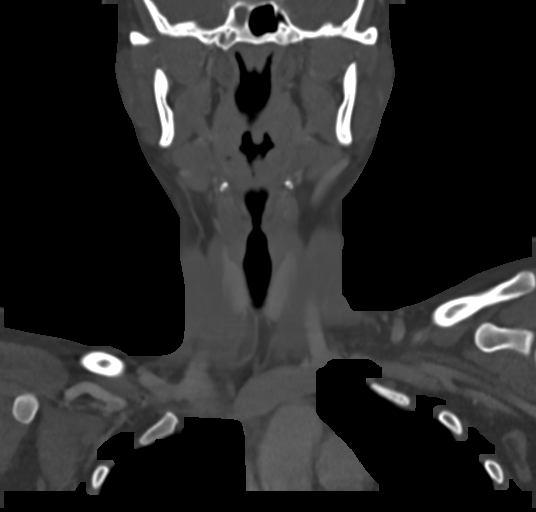
[im 72/120  bone]
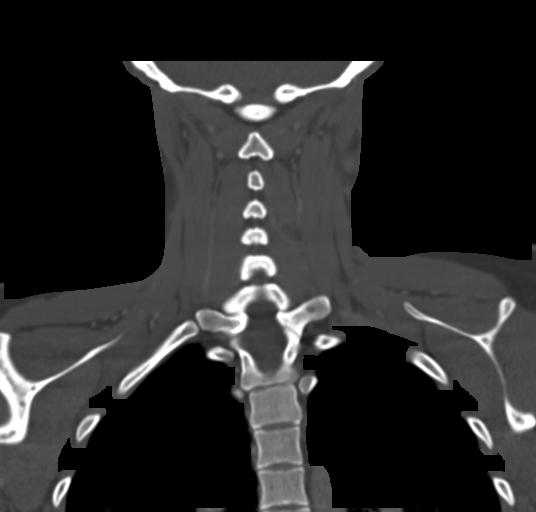

[Series 6: sagittal neck neck (person_name) 2.00 sag · sagittal · 0.47mm/px · 5 of 125 slices shown, 6 images]
[im 42/125  bone]
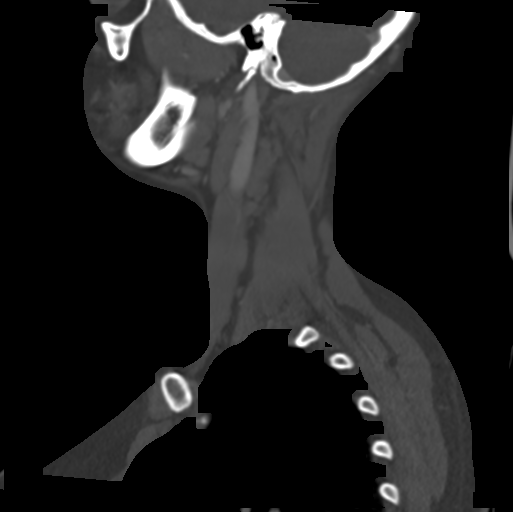
[im 52/125  bone]
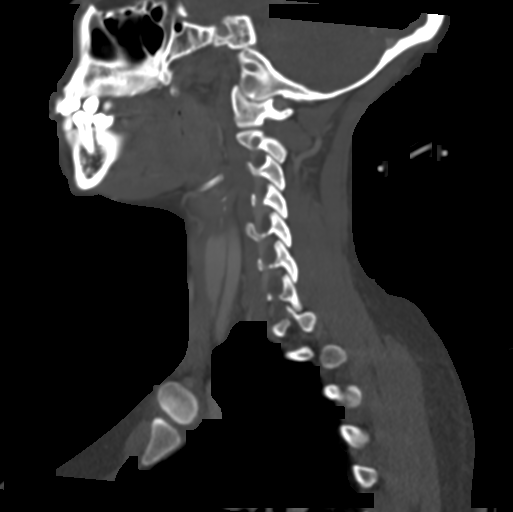
[im 63/125  soft-tissue]
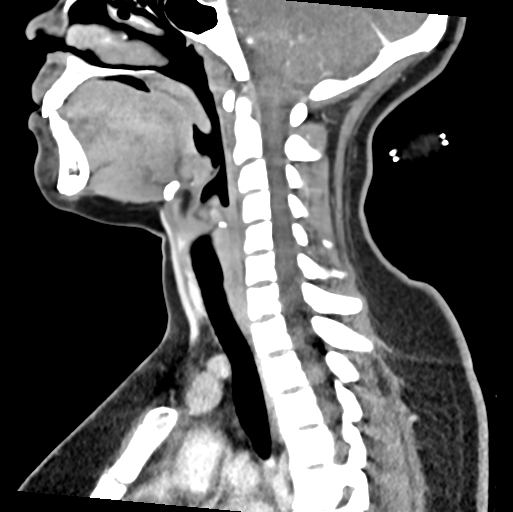
[im 63/125  bone]
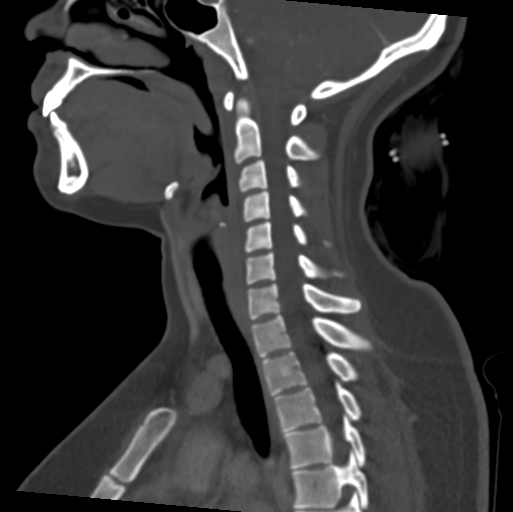
[im 73/125  bone]
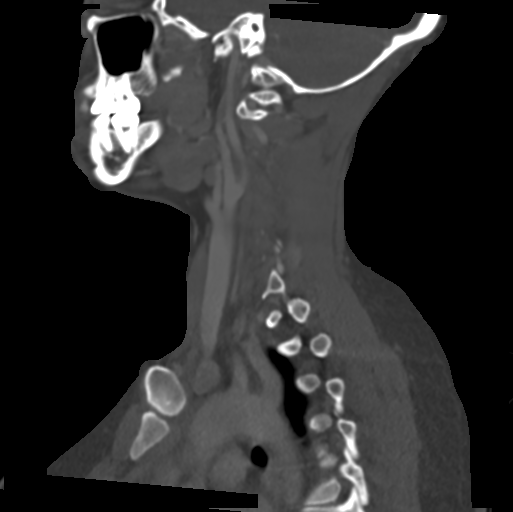
[im 83/125  bone]
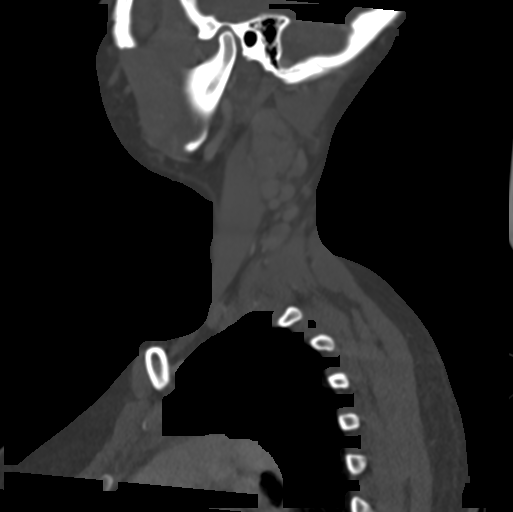

[Series 8: ax oropharynx neck neck (person_name) 2.00 ax · axial · 0.47mm/px · z∈[-735,-615]mm · 3 of 120 slices shown]
[im 30/120  bone]
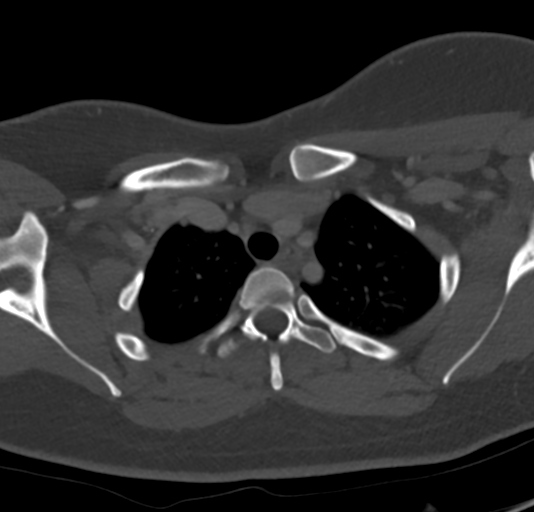
[im 60/120  bone]
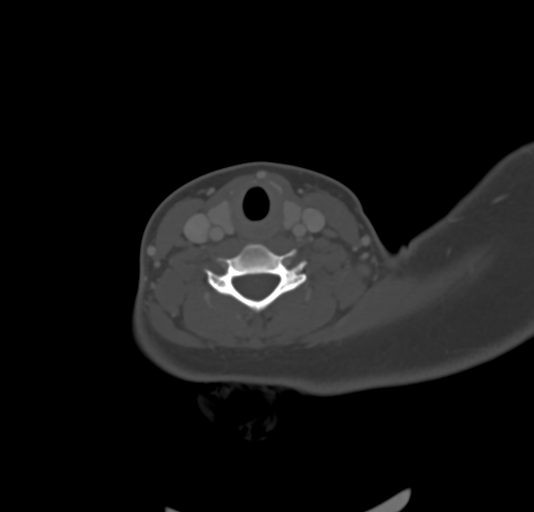
[im 90/120  bone]
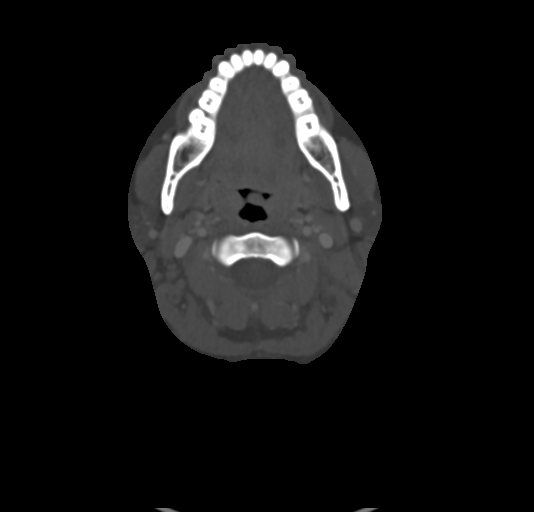

[16 of 33 positions shown; findings below may reference images not displayed]

FINDINGS: Pharynx and larynx: No masslike enhancement or asymmetry.

Salivary glands: No inflammation, mass, or stone.

Thyroid: Normal.

Lymph nodes: Asymmetric enlargement and increased enhancement of
left jugular chain lymph nodes with indistinct adjacent fat. A nodal
conglomerate in the upper left IJ chain, deep to the
sternocleidomastoid measures up to 2.4 cm. Right chain lymph nodes,
especially superiorly, are prominent in size but not as indistinct
or enhancing.

Vascular: Negative.

Limited intracranial: Negative.

Visualized orbits: Negative

Mastoids and visualized paranasal sinuses: Clear

Skeleton: No acute or aggressive process.

Upper chest: Negative.
IMPRESSION: Left jugular chain lymphadenopathy which could be adenitis or
lymphoproliferative disease ; fat stranding and history of
tenderness favoring the former. Depending on clinical course a
biopsy may be needed and the most superficial is the marked node in
the posterior triangle which is 4 mm below the skin surface.
